# Patient Record
Sex: Female | Born: 1995 | Race: Black or African American | Hispanic: No | Marital: Single | Smoking: Former smoker
Health system: Southern US, Community
[De-identification: ages and names within clinical notes are randomized; demographics above are authoritative.]

## PROBLEM LIST (undated history)

## (undated) ENCOUNTER — Ambulatory Visit (HOSPITAL_COMMUNITY)
Admission: EM | Payer: No Typology Code available for payment source | Source: Home / Self Care | Admitting: Unknown Physician Specialty

## (undated) DIAGNOSIS — F209 Schizophrenia, unspecified: Secondary | ICD-10-CM

## (undated) DIAGNOSIS — E785 Hyperlipidemia, unspecified: Secondary | ICD-10-CM

## (undated) DIAGNOSIS — K219 Gastro-esophageal reflux disease without esophagitis: Secondary | ICD-10-CM

## (undated) DIAGNOSIS — E559 Vitamin D deficiency, unspecified: Secondary | ICD-10-CM

## (undated) DIAGNOSIS — J309 Allergic rhinitis, unspecified: Secondary | ICD-10-CM

## (undated) DIAGNOSIS — F4481 Dissociative identity disorder: Secondary | ICD-10-CM

## (undated) DIAGNOSIS — R519 Headache, unspecified: Secondary | ICD-10-CM

## (undated) DIAGNOSIS — E282 Polycystic ovarian syndrome: Secondary | ICD-10-CM

## (undated) DIAGNOSIS — N926 Irregular menstruation, unspecified: Secondary | ICD-10-CM

## (undated) DIAGNOSIS — R7303 Prediabetes: Secondary | ICD-10-CM

## (undated) DIAGNOSIS — F419 Anxiety disorder, unspecified: Secondary | ICD-10-CM

## (undated) DIAGNOSIS — F32A Depression, unspecified: Secondary | ICD-10-CM

## (undated) DIAGNOSIS — R12 Heartburn: Secondary | ICD-10-CM

## (undated) DIAGNOSIS — J45909 Unspecified asthma, uncomplicated: Secondary | ICD-10-CM

## (undated) DIAGNOSIS — N939 Abnormal uterine and vaginal bleeding, unspecified: Secondary | ICD-10-CM

## (undated) DIAGNOSIS — R569 Unspecified convulsions: Secondary | ICD-10-CM

## (undated) HISTORY — DX: Unspecified asthma, uncomplicated: J45.909

## (undated) HISTORY — DX: Vitamin D deficiency, unspecified: E55.9

## (undated) HISTORY — DX: Headache, unspecified: R51.9

## (undated) HISTORY — DX: Heartburn: R12

## (undated) HISTORY — PX: NO PRIOR SURGERIES: 100

## (undated) HISTORY — DX: Hyperlipidemia, unspecified: E78.5

## (undated) HISTORY — DX: Abnormal uterine and vaginal bleeding, unspecified: N93.9

## (undated) HISTORY — DX: Allergic rhinitis, unspecified: J30.9

## (undated) HISTORY — DX: Gastro-esophageal reflux disease without esophagitis: K21.9

## (undated) HISTORY — DX: Anxiety disorder, unspecified: F41.9

---

## 2019-09-17 ENCOUNTER — Emergency Department: Payer: Medicaid Other

## 2019-09-17 ENCOUNTER — Emergency Department
Admission: EM | Admit: 2019-09-17 | Discharge: 2019-09-17 | Disposition: A | Discharge status: Home / Self Care | Payer: Medicaid Other | Attending: Emergency Medicine | Admitting: Emergency Medicine

## 2019-09-17 ENCOUNTER — Other Ambulatory Visit: Payer: Self-pay

## 2019-09-17 ENCOUNTER — Encounter: Payer: Self-pay | Admitting: Emergency Medicine

## 2019-09-17 DIAGNOSIS — Y999 Unspecified external cause status: Secondary | ICD-10-CM | POA: Diagnosis not present

## 2019-09-17 DIAGNOSIS — Y939 Activity, unspecified: Secondary | ICD-10-CM | POA: Diagnosis not present

## 2019-09-17 DIAGNOSIS — W0110XA Fall on same level from slipping, tripping and stumbling with subsequent striking against unspecified object, initial encounter: Secondary | ICD-10-CM | POA: Insufficient documentation

## 2019-09-17 DIAGNOSIS — S0990XA Unspecified injury of head, initial encounter: Secondary | ICD-10-CM | POA: Insufficient documentation

## 2019-09-17 DIAGNOSIS — Y92129 Unspecified place in nursing home as the place of occurrence of the external cause: Secondary | ICD-10-CM | POA: Insufficient documentation

## 2019-09-17 DIAGNOSIS — M542 Cervicalgia: Secondary | ICD-10-CM | POA: Insufficient documentation

## 2019-09-17 DIAGNOSIS — S161XXA Strain of muscle, fascia and tendon at neck level, initial encounter: Secondary | ICD-10-CM | POA: Insufficient documentation

## 2019-09-17 DIAGNOSIS — W19XXXA Unspecified fall, initial encounter: Secondary | ICD-10-CM

## 2019-09-17 DIAGNOSIS — J45909 Unspecified asthma, uncomplicated: Secondary | ICD-10-CM | POA: Diagnosis not present

## 2019-09-17 HISTORY — DX: Unspecified convulsions: R56.9

## 2019-09-17 HISTORY — DX: Unspecified asthma, uncomplicated: J45.909

## 2019-09-17 LAB — POCT PREGNANCY, URINE: Preg Test, Ur: NEGATIVE

## 2019-09-17 MED ORDER — CYCLOBENZAPRINE HCL 5 MG PO TABS
5.0000 mg | ORAL_TABLET | Freq: Three times a day (TID) | ORAL | 0 refills | Status: AC | PRN
Start: 2019-09-17 — End: ?

## 2019-09-17 MED ORDER — IBUPROFEN 800 MG PO TABS
800.0000 mg | ORAL_TABLET | Freq: Three times a day (TID) | ORAL | 0 refills | Status: AC | PRN
Start: 2019-09-17 — End: ?

## 2019-09-17 MED ORDER — ACETAMINOPHEN 325 MG PO TABS
650.0000 mg | ORAL_TABLET | Freq: Once | ORAL | Status: AC
  Administered 2019-09-17: 650 mg via ORAL
  Filled 2019-09-17: qty 2

## 2019-09-17 MED ORDER — ONDANSETRON 4 MG PO TBDP: 4.0000 mg | ORAL_TABLET | Freq: Once | ORAL | Status: DC

## 2019-09-17 MED ORDER — METOCLOPRAMIDE HCL 10 MG PO TABS
10.0000 mg | ORAL_TABLET | Freq: Once | ORAL | Status: AC
  Administered 2019-09-17: 12:00:00 10 mg via ORAL
  Filled 2019-09-17: qty 1

## 2019-09-17 NOTE — ED Provider Notes (Signed)
[facility] Emergency Department Provider Note ____________________________________________  Time seen: 1139  I have reviewed the triage vital signs and the nursing notes.  HISTORY  Chief Complaint  Fall  HPI Lindsey Smith is a 24 y.o. female presents to the ED from home via EMS, for a mechanical fall.  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  She describes walking across the hall to attend her client, and then walking back across to her bedroom.  She lifted up her foot because she thought she had stepped on something wet, and apparently slipped her feet from under herself causing her to fall backwards, hitting the back of her head and neck.  She describes an unclear  duration of syncope, noting that it was 630 when she went to attend her client, and by the time she remembers waking up and looking at her cell phone it was proximately 830.  She remained on the floor until she was able to stand up and get himself to the bed.  She called her mother and waited for the neighbor to come.  She eventually called EMS who evaluated her at the scene.  Patient was apparently alert and oriented.  She reports feeling dizzy and reports an out of 10 head and neck pain.  She was able to ambulate to the stretcher without difficulty.  Patient denies any history of syncope, near syncope, hypoglycemia, or severe headaches.  Past Medical History:  Diagnosis Date  . Asthma   . Seizures (HCC)    pt states she has them when she takes abilify    There are no problems to display for this patient.   History reviewed. No pertinent surgical history.  Prior to Admission medications   Medication Sig Start Date End Date Taking? Authorizing Provider  cyclobenzaprine (FLEXERIL) 5 MG tablet Take 1 tablet (5 mg total) by mouth 3 (three) times daily as needed. 09/17/19   Renette Hsu, Charlesetta Ivory, PA-C  ibuprofen (ADVIL) 800 MG tablet Take 1  tablet (800 mg total) by mouth every 8 (eight) hours as needed. 09/17/19   Jaedin Regina, Charlesetta Ivory, PA-C    Allergies Patient has no known allergies.  History reviewed. No pertinent family history.  Social History Social History   Tobacco Use  . Smoking status: Former Games developer  . Smokeless tobacco: Never Used  Substance Use Topics  . Alcohol use: Never  . Drug use: Yes    Types: Marijuana    Review of Systems  Constitutional: Negative for fever. Eyes: Negative for visual changes. ENT: Negative for sore throat. Cardiovascular: Negative for chest pain. Respiratory: Negative for shortness of breath. Gastrointestinal: Negative for abdominal pain, vomiting and diarrhea. Genitourinary: Negative for dysuria. Musculoskeletal: Negative for back pain.  Reports neck pain as above. Skin: Negative for rash. Neurological: Positive for headaches.  Denies any focal weakness or numbness. ____________________________________________  PHYSICAL EXAM:  VITAL SIGNS: ED Triage Vitals  Enc Vitals Group     BP 09/17/19 1126 (!) 152/99     Pulse Rate 09/17/19 1126 78     Resp 09/17/19 1126 16     Temp 09/17/19 1126 98.3 F (36.8 C)     Temp Source 09/17/19 1126 Oral     SpO2 09/17/19 1126 98 %     Weight 09/17/19 1130 (!) 308 lb (139.7 kg)     Height 09/17/19 1130 5\' 11"  (1.803 m)     Head Circumference --  Peak Flow --      Pain Score 09/17/19 1130 10     Pain Loc --      Pain Edu? --      Excl. in [place]? --     Constitutional: Alert and oriented. Well appearing and in no distress. GCS=15 Head: Normocephalic and atraumatic. Eyes: Conjunctivae are normal. PERRL. Normal extraocular movements Nose: No congestion/rhinorrhea/epistaxis. Neck: Supple. Tenderness to the right lateral neck. C-collar in place.  Cardiovascular: Normal rate, regular rhythm. Normal distal pulses. Respiratory: Normal respiratory effort. No wheezes/rales/rhonchi. Gastrointestinal: Soft and nontender. No  distention. Musculoskeletal: Nontender with normal range of motion in all extremities.  Normal composite fist bilaterally. Neurologic: Cranial nerves II through XII grossly intact.  Normal UE/LE DTRs bilaterally.  Normal intrinsic and opposition testing noted.  Normal gait without ataxia. Normal speech and language. No gross focal neurologic deficits are appreciated. Skin:  Skin is warm, dry and intact. No rash noted. Psychiatric: Mood and affect are normal. Patient exhibits appropriate insight and judgment. ___________________________________________   LABORATORY  Labs Reviewed  POCT PREGNANCY, URINE  ___________________________________________  RADIOLOGY  CT Head & Cervical Spine w/o CM  IMPRESSION: Head CT: Normal.  Cervical spine CT: Normal. ____________________________________________  PROCEDURES  Reglan 10 mg PO Tylenol 650 mg PO  Procedures ____________________________________________  INITIAL IMPRESSION / ASSESSMENT AND PLAN / ED COURSE  Patient with ED evaluation of injury sustained following a mechanical fall.  Patient apparently lost her footing, and fell hitting the back of her head.  She was apparently unconscious for unspecified length of time.  She presents now with stable exam overall.  Images of the head and neck are negative for any acute findings.  Patient is alert, oriented, and appears in no acute distress patient we discharged at this time with prescriptions for cyclobenzaprine and ibuprofen.  She is encouraged to follow-up with primary provider return to the ED as necessary.  Dorathea Faerber was evaluated in Emergency Department on 09/17/2019 for the symptoms described in the history of present illness. She was evaluated in the context of the global COVID-19 pandemic, which necessitated consideration that the patient might be at risk for infection with the SARS-CoV-2 virus that causes COVID-19. Institutional protocols and algorithms that pertain to the  evaluation of patients at risk for COVID-19 are in a state of rapid change based on information released by regulatory bodies including the CDC and federal and state organizations. These policies and algorithms were followed during the patient's care in the ED. ____________________________________________  FINAL CLINICAL IMPRESSION(S) / ED DIAGNOSES  Final diagnoses:  Fall, initial encounter  Minor head injury, initial encounter  Acute strain of neck muscle, initial encounter      Melvenia Needles, PA-C 09/17/19 1856    Lavonia Drafts, MD 09/18/19 1447

## 2019-09-17 NOTE — ED Triage Notes (Signed)
Pt arrival via [facility] from work due to fall. Pt states that she working this morning when she had a mechanical fall. Pt denies LOC when it happened but states she doesn't remember much after the fall. Pt hit the back of her head and is having current neck and head pain. EMS states no hematoma/laceration is present.   Pt states she's feeling slightly dizzy. Pt states her head and her neck pain is a 10/10 on her right side.  Pt ambulated from EMS stretcher to bed with no difficulty, NAD noted, patient a&ox4.

## 2019-09-17 NOTE — ED Notes (Signed)
Pt states that she has severe neck and head pain that is a 10/10.

## 2019-09-17 NOTE — Discharge Instructions (Addendum)
Your exam and CT scans are negative at this time. Take the prescription meds as directed. Follow-up with your provider for continued symptoms.

## 2021-06-14 DIAGNOSIS — F411 Generalized anxiety disorder: Secondary | ICD-10-CM | POA: Insufficient documentation

## 2021-06-14 DIAGNOSIS — N921 Excessive and frequent menstruation with irregular cycle: Secondary | ICD-10-CM | POA: Insufficient documentation

## 2021-06-14 DIAGNOSIS — N939 Abnormal uterine and vaginal bleeding, unspecified: Secondary | ICD-10-CM | POA: Insufficient documentation

## 2021-06-14 DIAGNOSIS — F32A Depression, unspecified: Secondary | ICD-10-CM | POA: Insufficient documentation

## 2021-06-14 DIAGNOSIS — N920 Excessive and frequent menstruation with regular cycle: Secondary | ICD-10-CM | POA: Insufficient documentation

## 2021-06-18 ENCOUNTER — Other Ambulatory Visit (HOSPITAL_COMMUNITY): Payer: Self-pay | Admitting: Naturopath

## 2021-06-18 DIAGNOSIS — E282 Polycystic ovarian syndrome: Secondary | ICD-10-CM

## 2021-07-05 DIAGNOSIS — N760 Acute vaginitis: Secondary | ICD-10-CM | POA: Insufficient documentation

## 2021-07-06 ENCOUNTER — Ambulatory Visit (HOSPITAL_BASED_OUTPATIENT_CLINIC_OR_DEPARTMENT_OTHER)
Admit: 2021-07-06 | Discharge: 2021-07-06 | Disposition: A | Discharge status: Home / Self Care | Payer: No Typology Code available for payment source | Source: Home / Self Care

## 2021-07-06 ENCOUNTER — Emergency Department (HOSPITAL_COMMUNITY): Payer: No Typology Code available for payment source

## 2021-07-06 ENCOUNTER — Emergency Department
Admission: EM | Admit: 2021-07-06 | Discharge: 2021-07-06 | Disposition: A | Discharge status: Home / Self Care | Payer: No Typology Code available for payment source | Attending: Emergency Medicine | Admitting: Emergency Medicine

## 2021-07-06 ENCOUNTER — Encounter (HOSPITAL_COMMUNITY): Payer: Self-pay

## 2021-07-06 ENCOUNTER — Other Ambulatory Visit (HOSPITAL_BASED_OUTPATIENT_CLINIC_OR_DEPARTMENT_OTHER): Payer: Self-pay

## 2021-07-06 ENCOUNTER — Other Ambulatory Visit: Payer: Self-pay

## 2021-07-06 DIAGNOSIS — E282 Polycystic ovarian syndrome: Secondary | ICD-10-CM

## 2021-07-06 DIAGNOSIS — N83291 Other ovarian cyst, right side: Secondary | ICD-10-CM

## 2021-07-06 DIAGNOSIS — N730 Acute parametritis and pelvic cellulitis: Secondary | ICD-10-CM | POA: Insufficient documentation

## 2021-07-06 DIAGNOSIS — R1030 Lower abdominal pain, unspecified: Secondary | ICD-10-CM

## 2021-07-06 HISTORY — DX: Depression, unspecified: F32.A

## 2021-07-06 HISTORY — DX: Prediabetes: R73.03

## 2021-07-06 HISTORY — DX: Dissociative identity disorder: F44.81

## 2021-07-06 HISTORY — DX: Schizophrenia, unspecified: F20.9

## 2021-07-06 LAB — URINALYSIS WITH REFLEX CULTURE
Bacteria, URN: NONE SEEN
Bilirubin (Qual), URN: NEGATIVE
Epith Cells_Renal/Trans,URN: NEGATIVE /HPF
Glucose Qual, URN: NEGATIVE
Ketones, URN: NEGATIVE
Leukocyte Esterase, URN: POSITIVE — AB
Nitrite, URN: NEGATIVE
Occult Blood, URN: NEGATIVE
RBC, URN: NEGATIVE /HPF
Specific Gravity, URN: 1.034 g/mL — ABNORMAL HIGH (ref 1.006–1.027)
WBC, URN: NEGATIVE /HPF
pH, URN: 6 (ref 5.0–8.0)

## 2021-07-06 LAB — CBC, DIFF
% Basophils: 1 %
% Eosinophils: 4 %
% Immature Granulocytes: 0 %
% Lymphocytes: 35 %
% Monocytes: 8 %
% Neutrophils: 52 %
% Nucleated RBC: 0 %
Absolute Eosinophil Count: 0.42 10*3/uL (ref 0.00–0.50)
Absolute Lymphocyte Count: 3.63 10*3/uL (ref 1.00–4.80)
Basophils: 0.05 10*3/uL (ref 0.00–0.20)
Hematocrit: 37 % (ref 36.0–45.0)
Hemoglobin: 11.6 g/dL (ref 11.5–15.5)
Immature Granulocytes: 0.02 10*3/uL (ref 0.00–0.05)
MCH: 24.2 pg — ABNORMAL LOW (ref 27.3–33.6)
MCHC: 31.7 g/dL — ABNORMAL LOW (ref 32.2–36.5)
MCV: 76 fL — ABNORMAL LOW (ref 81–98)
Monocytes: 0.85 10*3/uL — ABNORMAL HIGH (ref 0.00–0.80)
Neutrophils: 5.56 10*3/uL (ref 1.80–7.00)
Nucleated RBC: 0 10*3/uL
Platelet Count: 277 10*3/uL (ref 150–400)
RBC: 4.79 10*6/uL (ref 3.80–5.00)
RDW-CV: 15.4 % — ABNORMAL HIGH (ref 11.0–14.5)
WBC: 10.53 10*3/uL — ABNORMAL HIGH (ref 4.3–10.0)

## 2021-07-06 LAB — COMPREHENSIVE METABOLIC PANEL
ALT (GPT): 10 U/L (ref 7–33)
AST (GOT): 13 U/L (ref 9–38)
Albumin: 3.8 g/dL (ref 3.5–5.2)
Alkaline Phosphatase (Total): 58 U/L (ref 25–100)
Anion Gap: 8 (ref 4–12)
Bilirubin (Total): 0.2 mg/dL (ref 0.2–1.3)
Calcium: 8.3 mg/dL — ABNORMAL LOW (ref 8.9–10.2)
Carbon Dioxide, Total: 25 meq/L (ref 22–32)
Chloride: 104 meq/L (ref 98–108)
Creatinine: 0.69 mg/dL (ref 0.38–1.02)
Glucose: 106 mg/dL (ref 62–125)
Potassium: 3.8 meq/L (ref 3.6–5.2)
Protein (Total): 7 g/dL (ref 6.0–8.2)
Sodium: 137 meq/L (ref 135–145)
Urea Nitrogen: 13 mg/dL (ref 8–21)
eGFR by CKD-EPI 2021: >60 mL/min/{1.73_m2} (ref >59)

## 2021-07-06 LAB — LIPASE: Lipase: 22 U/L (ref <70)

## 2021-07-06 LAB — PREGNANCY TEST, URINE
Pregnancy Test, URN: NEGATIVE
Specific Gravity, URN: 1.031 g/mL — ABNORMAL HIGH (ref 1.006–1.027)

## 2021-07-06 LAB — 1ST EXTRA RED TOP

## 2021-07-06 LAB — REFLEX CULTURE FOR UA

## 2021-07-06 MED ORDER — OXYCODONE HCL 5 MG OR TABS
5.0000 mg | ORAL_TABLET | ORAL | 0 refills | Status: DC | PRN
Start: 2021-07-06 — End: 2021-07-06
  Filled 2021-07-06: qty 10, 2d supply, fill #0

## 2021-07-06 MED ORDER — ONDANSETRON 4 MG OR TBDP
4.0000 mg | ORAL_TABLET | Freq: Two times a day (BID) | ORAL | 0 refills | Status: DC | PRN
Start: 2021-07-06 — End: 2021-08-19
  Filled 2021-07-06: qty 20, 10d supply, fill #0

## 2021-07-06 MED ORDER — IOHEXOL 350 MG/ML IV SOLN
120.0000 mL | Freq: Once | INTRAVENOUS | Status: AC | PRN
Start: 2021-07-06 — End: 2021-07-06
  Administered 2021-07-06: 120 mL via INTRAVENOUS

## 2021-07-06 MED ORDER — HYDROMORPHONE HCL 1 MG/ML IJ SOLN
0.5000 mg | INTRAMUSCULAR | Status: DC | PRN
Start: 2021-07-06 — End: 2021-07-06
  Administered 2021-07-06: 1 mg via INTRAVENOUS
  Filled 2021-07-06: qty 1

## 2021-07-06 MED ORDER — METRONIDAZOLE IN NACL 500 MG/100ML IV SOLN (WRAPPER)
500.0000 mg | Freq: Once | INTRAVENOUS | Status: AC
Start: 2021-07-06 — End: 2021-07-06
  Administered 2021-07-06: 500 mg via INTRAVENOUS
  Filled 2021-07-06: qty 100

## 2021-07-06 MED ORDER — METRONIDAZOLE 500 MG OR TABS
500.0000 mg | ORAL_TABLET | Freq: Two times a day (BID) | ORAL | 0 refills | Status: DC
Start: 2021-07-06 — End: 2021-07-14
  Filled 2021-07-06: qty 28, 14d supply, fill #0

## 2021-07-06 MED ORDER — ONDANSETRON HCL 4 MG/2ML IJ SOLN
8.0000 mg | Freq: Once | INTRAMUSCULAR | Status: AC
Start: 2021-07-06 — End: 2021-07-06
  Administered 2021-07-06: 8 mg via INTRAVENOUS
  Filled 2021-07-06: qty 4

## 2021-07-06 MED ORDER — CEFTRIAXONE SODIUM IN DEXTROSE 40 MG/ML IV SOLN
2.0000 g | Freq: Once | INTRAVENOUS | Status: AC
Start: 2021-07-06 — End: 2021-07-06
  Administered 2021-07-06: 2 g via INTRAVENOUS
  Filled 2021-07-06: qty 50

## 2021-07-06 MED ORDER — DOXYCYCLINE MONOHYDRATE 100 MG OR CAPS
100.0000 mg | ORAL_CAPSULE | Freq: Two times a day (BID) | ORAL | 0 refills | Status: DC
Start: 2021-07-06 — End: 2021-07-09
  Filled 2021-07-06: qty 28, 14d supply, fill #0

## 2021-07-06 MED ORDER — DOXYCYCLINE HYCLATE 100 MG OR TABS
100.0000 mg | ORAL_TABLET | Freq: Once | ORAL | Status: AC
Start: 2021-07-06 — End: 2021-07-06
  Administered 2021-07-06: 100 mg via ORAL
  Filled 2021-07-06: qty 1

## 2021-07-06 MED ORDER — OXYCODONE HCL 5 MG OR TABS
5.0000 mg | ORAL_TABLET | ORAL | 0 refills | Status: DC | PRN
Start: 2021-07-06 — End: 2021-07-09
  Filled 2021-07-06: qty 10, 2d supply, fill #0

## 2021-07-06 MED ORDER — METRONIDAZOLE IN NACL 500 MG/100ML IV SOLN (WRAPPER)
500.0000 mg | Freq: Three times a day (TID) | INTRAVENOUS | Status: DC
Start: 2021-07-06 — End: 2021-07-06
  Filled 2021-07-06: qty 100

## 2021-07-06 NOTE — Discharge Instructions (Addendum)
Please follow up with the OBGYN service for an appointment on Friday. If you have a change in your symptoms or worsening pain, then please return to the ER for evaluation. The cyst that you have

## 2021-07-06 NOTE — ED Triage Notes (Signed)
C/O pain to pelvis and lower abdomen since 6 am. +Pain with urination this am. Denies N/D. Seen at [facility] clinic and had U/S done this am. Hx UTI.

## 2021-07-06 NOTE — ED Provider Notes (Signed)
CHIEF COMPLAINT   Chief Complaint   Patient presents with   . Urine Problem   . Pelvic Pain            HISTORY OF PRESENT ILLNESS   This is a 26 year old female that is in the emergency room for transverse lower abdominal pain which began earlier today.  She does report that she went to her primary care provider yesterday for evaluation of vaginitis and concerns for STI.  A ultrasound was ordered and the patient was able to obtain the ultrasound earlier today.  She does have PCOS by history.  She does report that she has pain with urination and frequency of urination.  She is concerned that she has a UTI.  Denies fever or chills.  She is sexually active, however does not use protection.                 PAST MEDICAL AND SURGICAL HISTORY                MEDICATIONS AND ALLERGIES     OUTPATIENT MEDICATIONS:   Current Outpatient Medications   Medication Instructions   . doxycycline monohydrate (MONODOX) 100 mg, Oral, 2 times daily   . metroNIDAZOLE (FLAGYL) 500 mg, Oral, 2 times daily with meals   . ondansetron (ZOFRAN ODT) 4 mg, Oral, Every 12 hours PRN   . oxyCODONE 5 mg, Oral, Every 4 hours PRN       ALLERGIES:   Abilify [aripiprazole]          SOCIAL HISTORY   Social History     Tobacco Use   . Smoking status: Never   Substance Use Topics   . Alcohol use: Yes     Alcohol/week: 2.0 standard drinks     Types: 2 Standard drinks or equivalent per week   . Drug use: Yes     Types: Marijuana              PAST FAMILY HISTORY   Family History     Problem (# of Occurrences) Relation (Name,Age of Onset)    Fibromyalgia (1) Mother             REVIEW OF SYSTEMS   Review of Systems   Constitutional: Negative for fever and chills.   HENT: Negative for ear pain and sore throat.    Eyes: Negative for pain and visual disturbance.   Respiratory: Negative for shortness of breath and cough.    Cardiovascular: Negative for chest pain and palpitations.   Gastrointestinal: Positive for abdominal pain. Negative for nausea and vomiting.    Genitourinary: Positive for dysuria and urgency. Negative for hematuria.   Musculoskeletal: Negative for back pain and arthralgias.   Skin: Negative for rash and color change.   Neurological: Negative for seizures and syncope.   All other systems reviewed and are negative.              PHYSICAL EXAM   ED VITALS:  Vitals (Arrival)      T: (!) 35.8 C (07/06/21 1113)  BP: 112/75 (07/06/21 1113)  HR: 95 (07/06/21 1113)  RR: 16 (07/06/21 1113)  SpO2: 100 % (07/06/21 1113)     Vitals (Most recent in last 24 hrs)   T: 36.6 C (07/06/21 1630)  BP: 136/77 (07/06/21 1953)  HR: 81 (07/06/21 1953)  RR: 15 (07/06/21 1953)  SpO2: 100 % (07/06/21 1953)    T range: Temp  Min: 35.8 C  Max: 36.6 C  (no weight taken  for this visit)     (no height taken for this visit)     There is no height or weight on file to calculate BMI.       Physical Exam  Vitals and nursing note reviewed.   Constitutional:       General: She is not in acute distress.     Appearance: She is well-developed.   HENT:      Head: Normocephalic and atraumatic.   Eyes:      Conjunctiva/sclera: Conjunctivae normal.   Neck:      Musculoskeletal: Neck supple.   Cardiovascular:      Rate and Rhythm: Normal rate and regular rhythm.      Heart sounds: No murmur heard.  Pulmonary:      Effort: Pulmonary effort is normal. No respiratory distress.      Breath sounds: Normal breath sounds.   Abdominal:      Palpations: Abdomen is soft.      Tenderness: There is no abdominal tenderness.   Musculoskeletal:      Cervical back: Neck supple.   Skin:     General: Skin is warm and dry.   Neurological:      Mental Status: She is alert.               LABORATORY:   Labs Reviewed   URINALYSIS WITH REFLEX CULTURE - Abnormal       Result Value    Color, URN Yellow      Clarity, URN Clear      Specific Gravity, URN 1.034 (*)     pH, URN 6.0      Protein (Alb Semiquant), URN Trace (*)     Glucose Qual, URN Negative      Ketones, URN Negative      Bilirubin (Qual), URN Negative       Occult Blood, URN Negative      Nitrite, URN Negative      Leukocyte Esterase, URN Positive (*)     Urobilinogen, URN 0.1-1.9      Comments for Macroscopic, URN None      Collection Method Urine      WBC, URN 0-5(NEG)      RBC, URN 0-2(NEG)      Bacteria, URN Not Seen      Epith Cells_Squamous, URN >5(PRESENT) (*)     Epith Cells_Renal/Trans,URN <3(NEG)      Comments For Microscopic, URN None      Mucus, URN Present (*)     1st Extra Urine [facility] Rx Additional collection tube     PREGNANCY TEST, URINE - Abnormal    Specific Gravity, URN 1.031 (*)     Pregnancy Test, URN Negative     CBC, DIFF - Abnormal    WBC 10.53 (*)     RBC 4.79      Hemoglobin 11.6      Hematocrit 37      MCV 76 (*)     MCH 24.2 (*)     MCHC 31.7 (*)     Platelet Count 277      RDW-CV 15.4 (*)     % Neutrophils 52      % Lymphocytes 35      % Monocytes 8      % Eosinophils 4      % Basophils 1      % Immature Granulocytes 0      Neutrophils 5.56      Absolute Lymphocyte Count 3.63  Monocytes 0.85 (*)     Absolute Eosinophil Count 0.42      Basophils 0.05      Immature Granulocytes 0.02      Nucleated RBC 0.00      % Nucleated RBC 0     COMPREHENSIVE METABOLIC PANEL - Abnormal    Sodium 137      Potassium 3.8      Chloride 104      Carbon Dioxide, Total 25      Anion Gap 8      Glucose 106      Urea Nitrogen 13      Creatinine 0.69      Protein (Total) 7.0      Albumin 3.8      Bilirubin (Total) 0.2      Calcium 8.3 (*)     AST (GOT) 13      Alkaline Phosphatase (Total) 58      ALT (GPT) 10      eGFR by CKD-EPI 2021 >60     REFLEX CULTURE FOR UA    Reflex Culture for UA Order Processed     LIPASE    Lipase 22     GC&CHLAM NUCLEIC ACID DETECTN    GC&Chlam NA Spec Desc Vaginal      Chlam Trachomatis Nucleic Acid        N.Gonorrhoeae(GC) Nucleic Acid       TRICHOMONAS NUCLEIC ACID    Trichomonas Nucleic Acid Spec. Vaginal      Trichomonas Nucleic Acid Res.       URINE C/S   URINE C/S         IMAGING:     ED Wet Read -   CT Abdomen And Pelvis w  Contrast   Final Result      7.6 cm right ovarian cystic lesion, which is better characterized on pelvic ultrasound performed on same day. As recommended on the ultrasound, consider short interval sonographic follow-up in 6 weeks.      Otherwise no acute abdominopelvic abnormality.      This is a preliminary report dictated by Willette Cluster, MD ([id number]). Unless a final report appears below, an attending radiologist has not reviewed the images, and the report is not finalized.      ATTENDING FINAL REVIEW:      I agree with the preliminary report.                             I have personally reviewed the images and agree with the report (or as edited).          Radiology Final Result -   No image results found.              EKG DOCUMENTATION                 TRAUMA/STROKE/STEMI ACTIVATION                 SUICIDE RISK EVALUATION                 SEPSIS               4TH  YEAR RESIDENT            ED COURSE/MEDICAL DECISION MAKING      In summary, Elfa Wooton is a 26 year old female, with PMH significant for polycystic ovary syndrome, who presents with dysuria and abdominal pain see HPI  for details. External and in system records of patient were reviewed. Patient is a Independent historian    Differential diagnoses include but not limited to: Ovarian torsion, ovarian cyst, UTI, pyelonephritis, tubo-ovarian abscess.     Patient is nontoxic and well-appearing. Vitals are stable and normal.  Physical examination detailed above and most notable for abdominal pain, however physical exam is difficult due to patient's body habitus.     Labs, EKG, and imaging ordered and interpreted:  Labs ordered as above, reviewed, and notable for mild elevation in white blood cell count., Imaging ordered as above, reviewed, and my interpretation is notable for quite large ovarian cyst., Urinalysis ordered as above, reviewed, and notable for no signs of infection.    The patient's symptoms are concerning for a intra-abdominal pathology and a  CT scan was obtained which did not show acute surgical pathology, however did comment on the large ovarian cyst along with free fluid in the abdomen.  The ultrasound from earlier was evaluated and no evidence of a torsion or abscess was noted.  I am concerned for intermittent ovarian torsion, and OB/GYN was contacted to evaluate the patient.    I discussed patient's presentation and/or management with OB/GYN who came to evaluate the patient.  The OB/GYN was thorough and felt that the patient did not have concerns for a intermittent torsion, however did have concerns for PID.  The OB/GYN did recommend antibiotic therapy and kindly helped to guide the patient's care.    Patient's social determinants of health of medical literacy affect their care and management.    Based on patient's HPI,  examination, and today's work-up, it is my clinical judgement patient's presentation is most consistent with PID.     Ultimately the patient's symptoms improved with analgesic.  She was started on antibiotic therapy for pelvic inflammatory disease.  She received her first doses here in the emergency room.  Patient was discharged with a prescription for analgesics and antibiotic therapy.  Patient is stable and was discharged home with strict return precautions and instructions to follow-up with OB/GYN within the week or PCP within 24-48 hours. Patients diagnosis does not meet criteria for admission at this time.                  CLINICAL IMPRESSION/DISPOSITION   Clinical Impressions:   [N73.0] PID (acute pelvic inflammatory disease)         Disposition: Discharge         CRITICAL CARE - ATTENDING ONLY            ADDITIONAL INFORMATION REVIEWED  - ATTENDING ONLY           Lynnea Maizes, PA-C  07/06/21 2125

## 2021-07-06 NOTE — Consults (Signed)
Consult Note     Ashlee Long") - DOB: 01-17-1996 (26 year old female)  Gender Identity: Female  Preferred Pronouns: she/her/hers  Admit Date: 07/06/2021  Code Status: No Order         Consults    CHIEF CONCERN / IDENTIFICATION:     Ashlee Long is a 26 year old female G2 P0020, currently on no contraception presenting for acute, severe pelvic pain with onset at 6 AM this morning.     SUBJECTIVE   HISTORY OF PRESENT ILLNESS:   Patient was seen yesterday at [facility] family medicine for "venereal diseases".  Patient reported that she has vaginitis constantly and until pH is off.  Patient last had unprotected intercourse on 05/23/2021.    Patient reports she had some alcoholic beverages yesterday evening and this morning she woke up at 6 AM and felt extreme pelvic pain that felt sharp and like somebody was "stabbing her".  She also tried to urinate and felt like her bladder cramped and was unable to empty her bladder.  She has had UTIs in the past and was unsure if this was a UTI as it felt similar.  Past history is notable for gonorrhea, chlamydia, trichomonas, BV.     She has a history of PCOS and reports irregular menses.  She said she bleeds nearly every day and sometimes passes clots.  There is really a time where she is not bleeding.  If anything, she may have 1 week where she does not have bleeding, but restarted again.  She wonders if this is related to genetics as her grandmother reported always being on her menses.     She had a pelvic ultrasound done today to evaluate pelvic structures which was notable for an enlarged R OV cyst approximately 7cm versus hydrosalpinx.  Labs notable for WBC 10.5 K.  UA positive for leuk esterase but negative for nitrites.  Negative WBC.    Patient reports past medical history significant for mood disorders including possible schizophrenia, depression, anxiety, multiple personality disorder?.  She has been prescribed different psychiatric medicines including  Seroquel, BuSpar, sertraline.  She has not been taking medications and prefers to self medicate with cannabis, sertraline and Seroquel as needed    Review of Dates  Dating Summary    No pregnancy episode available       Ultrasounds   07/06/2020  Uterus  ======  Uterus: Normal  Uterus details:  Normal in size and shape  Uterus position:           anteverted  Description of uterine malformations:            None  Myometrium:   No fibroids seen  Endometrium:  heterogteneous  Cervix details:  normal  Uterus long      50 mm  Uterus ap         32 mm  Uterus tr          40 mm  Uterus Vol        33.4 cm  Endometrial thickness, total    9.8 mm       Right Ovary  =========  Rt ovary details:           enlarged. cystic structure measured 7.2 x 6.8 x 5.4 cm  Rt ovary D1      77 mm  Rt ovary D2      79 mm  Rt ovary D3      61 mm  Rt ovary Vol  194.3 cm  Rt ovarian Doppler other findings:      Venous and arterial spectral flow documented       Left Ovary  ========  Lt ovary details:           Appears normal  Lt ovary D1      34 mm  Lt ovary D2      28 mm  Lt ovary D3      16 mm  Lt ovary Vol     7.9 cm  Lt ovarian Doppler other findings:      Arterial and venous spectral flow documented       Cul de Sac  =========  Free fluid visualized: mild free fluid seen with hyperechoic structure measured 0.9 x 4.1 x 0.5 cm, likely prominent epiploic appendage.       IMPRESSION  =========  Enlarged right ovary due a cyst measuring up to 7.2 cm on the right, which may be the source of the patient's pain. No torsion. Differential includes hydrosalpinx versus  ovarian cyst. Surveillance with [place] within 6 weeks is recommended.  Free fluid seen within the cul-de-sac, favoring physiology.      HISTORY         History reviewed. No pertinent surgical history.    Social History     Tobacco Use    Smoking status: Never   Substance and Sexual Activity    Alcohol use: Yes     Alcohol/week: 2.0 standard drinks     Types: 2 Standard drinks or  equivalent per week    Drug use: Yes     Types: Marijuana    Sexual activity: Yes       Family History   Problem Relation Age of Onset    Fibromyalgia Mother         PAST GYNECOLOGIC HISTORY:  Menses irregular, bleeding daily for 180 days, says occasionally one week without bleeding then restarts   Cervical Cancer Screening History: Unsure, has had paps before, unsure if normal   STI History: gonorrhea, chlamydia, trichomonas  Sexually Active with female partners, unprotected  History of Sexual Dysfunction/Trauma: Did not ask  Contraception:None, previously had a Mirena IUD 2020, was under the impression she couldn't get pregnant with PCOS    Negative screen for domestic violence.    MEDICATIONS:   SCHEDULED MEDICATIONS:     cefTRIAXone, 2 g, Once    metroNIDAZOLE, 500 mg, Once    INFUSED MEDICATIONS:       PRN MEDICATIONS:    HYDROmorphone, 0.5-1 mg, q15 min PRN      ALLERGIES:   Abilify [aripiprazole]     OBJECTIVE     Vitals (Arrival)      T: (!) 35.8 C (07/06/21 1113)  BP: 112/75 (07/06/21 1113)  HR: 95 (07/06/21 1113)  RR: 16 (07/06/21 1113)  SpO2: 100 % (07/06/21 1113)     Vitals (Most recent in last 24 hrs)   T: 36.6 C (07/06/21 1630)  BP: 121/72 (07/06/21 1630)  HR: 78 (07/06/21 1630)  RR: 16 (07/06/21 1630)  SpO2: 100 % (07/06/21 1630)    T range: Temp  Min: 35.8 C  Max: 36.6 C  (no weight taken for this visit)     (no height taken for this visit)     There is no height or weight on file to calculate BMI.       GENERAL: alert, no distress  NEURO:  Grossly normal to observation, gait normal  PSYCH: alert, oriented  to person, place, and time, affect appropriate to mood  HEAD/FACE: Normocephalic. No masses, lesions, tenderness or abnormalities  CARDIOVASCULAR: warm and well perfused  RESPIRATORY: Nonlabored breathing   ABDOMEN:  Soft, diffusely tender over bilateral left and right lower quadrants and bladder  EXTREMITIES: no edema  SKIN: Skin color, texture, turgor normal. No rashes or concerning  lesions   PELVIC: External genitalia: No lesions.  Vagina: no lesions, white discharge, profound CMT on exam, jumped away from examination hand even with minimal touch of the cervix. Equally tender in bilateral left and right adnexa           Labs (last 24 hours):   Chemistries  CBC  LFT  Gases, other   137 104 13 106   11.6   AST: 13 ALT: 10  -/-/-/-   -/-/-/-   3.8 25 0.69   10.53 >< 277  AP: 58 T bili: 0.2  Lact (a): - Lact (v): -   eGFR: >60 Ca: 8.3   37   Prot: 7.0 Alb: 3.8  Trop I: - D-dimer: -   Mg: - PO4: -  ANC: 5.56     BNP: - Anti-Xa: -     ALC: 3.63    INR: -        IMAGING: see above  I have reviewed the latest radiology results            ASSESSMENT/PLAN      #. Pelvic inflammatory disease: Patient with WBC 10.5, afebrile, but recent unprotected intercourse and history of STIs. Patient without nausea or vomiting. Torsion was considered as possible etiology given 7cm cyst, but tenderness equally bilaterally in adnexa. UTI considered based off of leuk esterase,however no nitrites. Recommended ED to send urine culture  - Diagnosed PID based on cervical motion tenderness which was profound on exam   - Collected GC/CT/trich (sent)  - Recommended ceftriaxone IM, metronidazole PO and doxy PO 14 day course  - Will have her f/u on Friday for assessment of clinical improvement in gyn clinic.     #. Adnexal cyst 7cm: Simple appearing. Uncertain if this is an acute physiologic process or enlarging pathologic process.  - Will assess resolution at follow-up visits and attempt to obtain previous records.  - Return precautions provided  - Given exam not concerning for torsion (no unilateral tenderness) discussed no indication for emergency surgery at this time.Fredia Beets, MD, MS  OBGYN Resident PGY4    Attending Attestation:   I discussed this patient's clinical presentation with Dr. Vella Raring and agree with her assessment. This patient will have close follow-up at [facility]. May well require  laparoscopic intervention if pain not improved, raising concern for possible unrecognized torsion.  Jettie Booze, MD  [facility] Obstetrics and Gynecology

## 2021-07-07 ENCOUNTER — Telehealth (HOSPITAL_BASED_OUTPATIENT_CLINIC_OR_DEPARTMENT_OTHER): Payer: Self-pay | Admitting: Unknown Physician Specialty

## 2021-07-07 LAB — URINE C/S: Culture: 11000 — AB

## 2021-07-07 LAB — GC&CHLAM NUCLEIC ACID DETECTN
Chlam Trachomatis Nucleic Acid: NEGATIVE
N.Gonorrhoeae(GC) Nucleic Acid: NEGATIVE

## 2021-07-07 LAB — TRICHOMONAS NUCLEIC ACID: Trichomonas Nucleic Acid Res.: NEGATIVE

## 2021-07-07 NOTE — Telephone Encounter (Signed)
Per Dr Campbell Stall this patient in the ED today. She has PID. She is getting outpatient tx as she met that criteria and looks well appearing. She needs a f/u appointment on Friday in [facility] preferrably.

## 2021-07-07 NOTE — Telephone Encounter (Signed)
Called pt to schedule follow up   UGYN full so put on Dr Evangeline Gula schedule at 2pm     Pt unable to come in the morning due to work schedule.

## 2021-07-09 ENCOUNTER — Other Ambulatory Visit (HOSPITAL_COMMUNITY): Payer: Self-pay

## 2021-07-09 ENCOUNTER — Encounter (HOSPITAL_BASED_OUTPATIENT_CLINIC_OR_DEPARTMENT_OTHER): Payer: Self-pay

## 2021-07-09 ENCOUNTER — Other Ambulatory Visit (HOSPITAL_BASED_OUTPATIENT_CLINIC_OR_DEPARTMENT_OTHER): Payer: Self-pay

## 2021-07-09 ENCOUNTER — Telehealth (HOSPITAL_BASED_OUTPATIENT_CLINIC_OR_DEPARTMENT_OTHER): Payer: Self-pay | Admitting: Unknown Physician Specialty

## 2021-07-09 ENCOUNTER — Encounter (HOSPITAL_BASED_OUTPATIENT_CLINIC_OR_DEPARTMENT_OTHER): Payer: Self-pay | Admitting: Obstetrics & Gynecology

## 2021-07-09 ENCOUNTER — Ambulatory Visit
Payer: No Typology Code available for payment source | Attending: Obstetrics & Gynecology | Admitting: Obstetrics & Gynecology

## 2021-07-09 VITALS — BP 117/77 | HR 85 | Temp 97.6°F | Wt 314.5 lb

## 2021-07-09 DIAGNOSIS — N83201 Unspecified ovarian cyst, right side: Secondary | ICD-10-CM | POA: Insufficient documentation

## 2021-07-09 DIAGNOSIS — R87610 Atypical squamous cells of undetermined significance on cytologic smear of cervix (ASC-US): Secondary | ICD-10-CM

## 2021-07-09 DIAGNOSIS — G8929 Other chronic pain: Secondary | ICD-10-CM

## 2021-07-09 DIAGNOSIS — N730 Acute parametritis and pelvic cellulitis: Secondary | ICD-10-CM | POA: Insufficient documentation

## 2021-07-09 DIAGNOSIS — N939 Abnormal uterine and vaginal bleeding, unspecified: Secondary | ICD-10-CM | POA: Insufficient documentation

## 2021-07-09 DIAGNOSIS — J45909 Unspecified asthma, uncomplicated: Secondary | ICD-10-CM | POA: Insufficient documentation

## 2021-07-09 MED ORDER — METRONIDAZOLE 0.75 % VA GEL
1.0000 | Freq: Every evening | VAGINAL | 0 refills | Status: DC
Start: 2021-07-09 — End: 2021-07-14
  Filled 2021-07-09: qty 70, 5d supply, fill #0

## 2021-07-09 MED ORDER — FLUCONAZOLE 150 MG OR TABS
150.0000 mg | ORAL_TABLET | Freq: Once | ORAL | 1 refills | Status: AC
Start: 2021-07-09 — End: 2021-07-10
  Filled 2021-07-09: qty 2, 4d supply, fill #0

## 2021-07-09 MED ORDER — ALBUTEROL SULFATE HFA 108 (90 BASE) MCG/ACT IN AERS
1.0000 | INHALATION_SPRAY | Freq: Four times a day (QID) | RESPIRATORY_TRACT | 1 refills | Status: DC | PRN
Start: 2021-07-09 — End: 2021-12-12
  Filled 2021-07-09: qty 8.5, 25d supply, fill #0

## 2021-07-09 MED ORDER — DOXYCYCLINE MONOHYDRATE 100 MG OR CAPS
100.0000 mg | ORAL_CAPSULE | Freq: Two times a day (BID) | ORAL | 0 refills | Status: DC
Start: 2021-07-09 — End: 2021-07-09
  Filled 2021-07-09: qty 14, 7d supply, fill #0

## 2021-07-09 MED ORDER — TRAMADOL HCL 50 MG OR TABS
50.0000 mg | ORAL_TABLET | Freq: Four times a day (QID) | ORAL | 1 refills | Status: AC | PRN
Start: 2021-07-09 — End: 2021-07-16
  Filled 2021-07-09: qty 12, 3d supply, fill #0

## 2021-07-09 NOTE — Patient Instructions (Signed)
Plan:     Pelvic inflammatory disease     Do a total course of 14 days of doxycycline antibiotics     Switch over to vaginal metrogel     I gave you a yeast pill to take after finishing all antibiotics     Surgery plan: 7 cm right ovarian cyst    Laparoscopic ovarian cystectomy, possible cyst drainage, possible unilateral salpingectomy (removal of a fallopian tube).     Consider doing endometrial biopsy and or IUD placement during this surgery for your bleeding history.     Go to ED for sudden worsening pain with nausea and vomiting.

## 2021-07-09 NOTE — Progress Notes (Addendum)
Ashlee Long visit  07/09/2021     Chief Complaint   Patient presents with   . Gyn Concern     Follow up check for PID     Subjective:     Ashlee Long is a 26 year old G2P0020 who presents on 07/09/2021     HISTORY OF PRESENT ILLNESS: Here for ED follow up. Seen by Dr. Delight Hoh on 2/28 and diagnosed with PID. + CMT, treated w/ 1 week of flagyl and doxycycline.   She reports the PID pain is improving. She has recent negative GC CT testing. She notes worsening of her RLQ pain, with intermittent sharp stabby pain.     We discussed wanting to "cool down" and successfully treat the PID before proceeding w/ laparoscopy for intermittent torsion if possible.  Risks of worsening infection weighed against risk of not wanting to compromise her ovary.     She does want to maintain the option of future fertility.     ED - pelvic ultrasound was notable for an enlarged R OV cyst approximately 7cm versus hydrosalpinx.  Labs notable for WBC 10.5 K.  UA positive for leuk esterase but negative for nitrites. She had IV CFX in the ED.   She has not been tolerating the metronidazole at all and vomits it up even w/ zofran.     Per last note -  Patient reports past medical history significant for mood disorders including possible schizophrenia, depression, anxiety, multiple personality disorder.  She has been prescribed different psychiatric medicines including Seroquel, BuSpar, sertraline.  She has not been taking medications and prefers to self medicate with cannabis, sertraline and Seroquel as needed    PAST GYNECOLOGIC HISTORY:  Menses irregular, bleeding daily for 180 days, says occasionally one week without bleeding then restarts   Has had EMB before in the past   Bleeding responded to her Mirena IUD, COCs were not helpful   Cervical Cancer Screening History: Unsure, has had paps before, unsure if normal   STI History: gonorrhea, chlamydia, trichomonas  Sexually Active with female partners, unprotected  Contraception: None,  previously had a Mirena IUD 2020     OB history:   TOP x 2    Immunizations: [  ] did not ask about Gardasil     Past Medical History:   Diagnosis Date   . Depression    . 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HCC)    . Pre-diabetes    . Schizophrenia (HCC)      No past surgical history on file.  No prior surgeries     Current Outpatient Medications   Medication Sig Dispense Refill   . albuterol HFA (ProAir HFA) 108 (90 Base) MCG/ACT inhaler Inhale 1 to  2 puffs by mouth every 6 hours as needed for shortness of breath/wheezing. 8.5 g 1   . doxycycline monohydrate 100 MG capsule Take 1 capsule (100 mg) by mouth 2 times a day for 7 days for infection. Take until gone. 14 capsule 0   . fluconazole 150 MG tablet Take 1 tablet (150 mg) by mouth one time for 1 dose. Repeat again in 3 days 2 tablet 1   . metroNIDAZOLE 0.75 % vaginal gel Place 1 applicatorful into the vagina at bedtime for 5 days. 70 g 0   . metroNIDAZOLE 500 MG tablet Take 1 tablet (500 mg) by mouth 2 times a day with meals for 14 days. 28 tablet 0   . ondansetron 4 MG disintegrating tablet Dissolve 1 tablet (4  mg) on top of tongue and swallow every 12 hours as needed for nausea/vomiting. 20 tablet 0   . traMADol 50 MG tablet Take 1 tablet (50 mg) by mouth every 6 hours as needed for moderate pain or severe pain for up to 7 days. 12 tablet 1     No current facility-administered medications for this visit.      No allergies   Itching w/ percocet       Objective:      Vitals:    07/09/21 1321   BP: 117/77   BP Cuff Size: Large   BP Site: Right Arm   BP Position: Sitting   Pulse: 85   Temp: 36.4 C   TempSrc: Temporal   SpO2: 99%   Weight: (!) 142.7 kg (314 lb 8 oz)       PE:  NAD  Heart: RRR extremities warm and well perfused  Lung: CTAB  breathing easily on RA   Abd:  soft, nontender, no rebound or guarding   Ext:  no calf tenderness, no swelling  Pelvic:   External genitalia normal, no vulvar lesions   Normal vagina, physiologic vaginal discharge   Normal  appearing cervix, no cervical lesions   Normal sized uterus on bimanual exam   R sided adnexal tenderness - unable to clearly palpate a mass due to tenderness   CMT has resolved     Assessment and Plan:        Pelvic inflammatory disease:   - Diagnosed PID based on cervical motion tenderness   - GC/CT/trich (negative)  - Recommended ceftriaxone IM, metronidazole PO and doxy PO 14 day course  - She can pause the flagyl as not tolerating PO, given 14 day doxy course    #. Adnexal cyst 7cm: Simple appearing. Unilateral pain in this area is persistent and she is concerned for intermittent torsion   - Discussed need for expedited surgery but would ideally wait until her PID has been treated   - Reviewed precautions and s/sx acute torsion and reasons to return to ED for emergency surgery       [name] was seen today for gyn concern.    PID (acute pelvic inflammatory disease)  -     Referral to Obstetrics and [facility]  -     doxycycline monohydrate 100 MG capsule; Take 1 capsule (100 mg) by mouth 2 times a day for 7 days for infection. Take until gone.  Dispense: 14 capsule; Refill: 0  -     metroNIDAZOLE 0.75 % vaginal gel; Place 1 applicatorful into the vagina at bedtime for 5 days.  Dispense: 70 g; Refill: 0  -     fluconazole 150 MG tablet; Take 1 tablet (150 mg) by mouth one time for 1 dose. Repeat again in 3 days  Dispense: 2 tablet; Refill: 1  -     traMADol 50 MG tablet; Take 1 tablet (50 mg) by mouth every 6 hours as needed for moderate pain or severe pain for up to 7 days.  Dispense: 12 tablet; Refill: 1  -     albuterol HFA (ProAir HFA) 108 (90 Base) MCG/ACT inhaler; Inhale 1 to  2 puffs by mouth every 6 hours as needed for shortness of breath/wheezing.  Dispense: 8.5 g; Refill: 1    Right ovarian cyst      Plan for surgery shortly after completing PO antbx course, unless her pain has resolved, then would repeat US.     She is aware  we would typically proceed immediately/expeditiously for suspected  torsion, but interpreting her pain in the setting of diagnosed PID is more complicated and would not want to do surgery right away if this was a TOA (ideal to tx w/ antibiotics first since she is stable).     Consents signed. Will send prep for procedure. Ordered heparin, ancef.     Kathlen Mody MD

## 2021-07-09 NOTE — Addendum Note (Signed)
Addended by: Kathlen Mody E on: 07/09/2021 03:28 PM     Modules accepted: Orders

## 2021-07-09 NOTE — Telephone Encounter (Signed)
Dr. Vern Claude,  Please review. Patient was seen and preop completed in clinic with Dr. Doreene Burke for EUA, [facility] Ovarian cystectomy, possible cyst drainage, possible [place], possible EMB.    Scheduled with you for 3/28 into released gyn onc block.

## 2021-07-10 DIAGNOSIS — E611 Iron deficiency: Secondary | ICD-10-CM | POA: Insufficient documentation

## 2021-07-10 DIAGNOSIS — R102 Pelvic and perineal pain: Secondary | ICD-10-CM | POA: Insufficient documentation

## 2021-07-11 ENCOUNTER — Ambulatory Visit
Admission: EM | Admit: 2021-07-11 | Discharge: 2021-07-12 | Disposition: A | Discharge status: Home / Self Care | Payer: No Typology Code available for payment source | Attending: Obstetrics & Gynecology | Admitting: Obstetrics & Gynecology

## 2021-07-11 ENCOUNTER — Emergency Department (EMERGENCY_DEPARTMENT_HOSPITAL): Payer: No Typology Code available for payment source

## 2021-07-11 ENCOUNTER — Encounter (HOSPITAL_BASED_OUTPATIENT_CLINIC_OR_DEPARTMENT_OTHER): Payer: No Typology Code available for payment source | Admitting: Unknown Physician Specialty

## 2021-07-11 ENCOUNTER — Encounter (HOSPITAL_COMMUNITY)
Admission: EM | Disposition: A | Discharge status: Home / Self Care | Payer: Self-pay | Source: Home / Self Care | Attending: Emergency Medicine

## 2021-07-11 ENCOUNTER — Other Ambulatory Visit: Payer: Self-pay

## 2021-07-11 ENCOUNTER — Encounter (HOSPITAL_COMMUNITY): Payer: Self-pay

## 2021-07-11 ENCOUNTER — Ambulatory Visit (HOSPITAL_COMMUNITY): Payer: Self-pay

## 2021-07-11 ENCOUNTER — Encounter (HOSPITAL_COMMUNITY): Payer: Self-pay | Admitting: Unknown Physician Specialty

## 2021-07-11 ENCOUNTER — Encounter (HOSPITAL_COMMUNITY): Payer: Self-pay | Admitting: Student in an Organized Health Care Education/Training Program

## 2021-07-11 ENCOUNTER — Other Ambulatory Visit (HOSPITAL_BASED_OUTPATIENT_CLINIC_OR_DEPARTMENT_OTHER): Payer: Self-pay

## 2021-07-11 ENCOUNTER — Ambulatory Visit (HOSPITAL_BASED_OUTPATIENT_CLINIC_OR_DEPARTMENT_OTHER): Admit: 2021-07-11 | Payer: No Typology Code available for payment source | Admitting: Obstetrics & Gynecology

## 2021-07-11 DIAGNOSIS — N939 Abnormal uterine and vaginal bleeding, unspecified: Secondary | ICD-10-CM | POA: Insufficient documentation

## 2021-07-11 DIAGNOSIS — N83201 Unspecified ovarian cyst, right side: Secondary | ICD-10-CM | POA: Insufficient documentation

## 2021-07-11 DIAGNOSIS — R69 Illness, unspecified: Secondary | ICD-10-CM

## 2021-07-11 DIAGNOSIS — R102 Pelvic and perineal pain: Secondary | ICD-10-CM | POA: Insufficient documentation

## 2021-07-11 DIAGNOSIS — R1909 Other intra-abdominal and pelvic swelling, mass and lump: Secondary | ICD-10-CM

## 2021-07-11 DIAGNOSIS — N739 Female pelvic inflammatory disease, unspecified: Secondary | ICD-10-CM

## 2021-07-11 DIAGNOSIS — J45909 Unspecified asthma, uncomplicated: Secondary | ICD-10-CM | POA: Insufficient documentation

## 2021-07-11 HISTORY — DX: Polycystic ovarian syndrome: E28.2

## 2021-07-11 HISTORY — PX: LAPAROSCOPY DIAGNOSTIC / BIOPSY / ASPIRATION / LYSIS: SUR762

## 2021-07-11 HISTORY — DX: Irregular menstruation, unspecified: N92.6

## 2021-07-11 LAB — CBC, DIFF
% Basophils: 1 %
% Eosinophils: 3 %
% Immature Granulocytes: 0 %
% Lymphocytes: 32 %
% Monocytes: 9 %
% Neutrophils: 55 %
% Nucleated RBC: 0 %
Absolute Eosinophil Count: 0.31 10*3/uL (ref 0.00–0.50)
Absolute Lymphocyte Count: 3.43 10*3/uL (ref 1.00–4.80)
Basophils: 0.05 10*3/uL (ref 0.00–0.20)
Hematocrit: 37 % (ref 36.0–45.0)
Hemoglobin: 12 g/dL (ref 11.5–15.5)
Immature Granulocytes: 0.04 10*3/uL (ref 0.00–0.05)
MCH: 24.4 pg — ABNORMAL LOW (ref 27.3–33.6)
MCHC: 32.3 g/dL (ref 32.2–36.5)
MCV: 76 fL — ABNORMAL LOW (ref 81–98)
Monocytes: 0.94 10*3/uL — ABNORMAL HIGH (ref 0.00–0.80)
Neutrophils: 5.84 10*3/uL (ref 1.80–7.00)
Nucleated RBC: 0 10*3/uL
Platelet Count: 327 10*3/uL (ref 150–400)
RBC: 4.91 10*6/uL (ref 3.80–5.00)
RDW-CV: 15.4 % — ABNORMAL HIGH (ref 11.0–14.5)
WBC: 10.61 10*3/uL — ABNORMAL HIGH (ref 4.3–10.0)

## 2021-07-11 LAB — PROTHROMBIN & PTT
Partial Thromboplastin Time: 27 s (ref 22–35)
Prothrombin INR: 1 (ref 0.8–1.3)
Prothrombin Time Patient: 13.4 s (ref 10.7–15.6)

## 2021-07-11 LAB — COMPREHENSIVE METABOLIC PANEL
ALT (GPT): 15 U/L (ref 7–33)
AST (GOT): 19 U/L (ref 9–38)
Albumin: 4.2 g/dL (ref 3.5–5.2)
Alkaline Phosphatase (Total): 54 U/L (ref 25–100)
Anion Gap: 8 (ref 4–12)
Bilirubin (Total): 0.3 mg/dL (ref 0.2–1.3)
Calcium: 9.1 mg/dL (ref 8.9–10.2)
Carbon Dioxide, Total: 25 meq/L (ref 22–32)
Chloride: 103 meq/L (ref 98–108)
Creatinine: 0.67 mg/dL (ref 0.38–1.02)
Glucose: 90 mg/dL (ref 62–125)
Potassium: 3.7 meq/L (ref 3.6–5.2)
Protein (Total): 7.4 g/dL (ref 6.0–8.2)
Sodium: 136 meq/L (ref 135–145)
Urea Nitrogen: 13 mg/dL (ref 8–21)
eGFR by CKD-EPI 2021: >60 mL/min/{1.73_m2} (ref >59)

## 2021-07-11 LAB — 1ST EXTRA BLUE TOP

## 2021-07-11 LAB — 1ST EXTRA LAVENDER TOP

## 2021-07-11 LAB — SARS-COV-2 (COVID-19) QUALITATIVE RAPID PCR: COVID-19 Coronavirus Qual PCR Result: NOT DETECTED

## 2021-07-11 LAB — 1ST EXTRA PEARL TOP

## 2021-07-11 LAB — 1ST EXTRA LIME GREEN TOP

## 2021-07-11 LAB — PREGNANCY (HCG), SERUM, QUANT: Pregnancy (HCG), SRM: <1 m[IU]/mL (ref <6)

## 2021-07-11 LAB — 1ST EXTRA GOLD TOP

## 2021-07-11 LAB — MAGNESIUM: Magnesium: 1.9 mg/dL (ref 1.8–2.4)

## 2021-07-11 SURGERY — EXCISION, CYST, OVARY, LAPAROSCOPIC
Anesthesia: General | Site: Abdomen | Wound class: Class II/ Clean Contaminated

## 2021-07-11 MED ORDER — LIDOCAINE 4 % EX CREA
TOPICAL_CREAM | CUTANEOUS | Status: DC | PRN
Start: 2021-07-11 — End: 2021-07-14

## 2021-07-11 MED ORDER — HYDROMORPHONE HCL 1 MG/ML IJ SOLN
INTRAMUSCULAR | Status: DC
Start: 2021-07-11 — End: 2021-07-12
  Filled 2021-07-11: qty 1

## 2021-07-11 MED ORDER — LIDOCAINE HCL 1 % IJ SOLN
INTRAMUSCULAR | Status: DC | PRN
Start: 2021-07-11 — End: 2021-07-11
  Administered 2021-07-11: 60 mg via INTRAVENOUS

## 2021-07-11 MED ORDER — PROPOFOL 10 MG/ML IV EMUL WRAPPER (OSM ONLY)
INTRAVENOUS | Status: DC | PRN
Start: 2021-07-11 — End: 2021-07-11
  Administered 2021-07-11: 150 ug/kg/min via INTRAVENOUS

## 2021-07-11 MED ORDER — AMOXICILLIN-POT CLAVULANATE 875-125 MG OR TABS
875.0000 mg | ORAL_TABLET | Freq: Two times a day (BID) | ORAL | 0 refills | Status: AC
Start: 2021-07-11 — End: 2021-07-18
  Filled 2021-07-11: qty 14, 7d supply, fill #0

## 2021-07-11 MED ORDER — OXYCODONE HCL 5 MG OR TABS
5.0000 mg | ORAL_TABLET | ORAL | Status: DC | PRN
Start: 2021-07-11 — End: 2021-07-14

## 2021-07-11 MED ORDER — PLASMA-LYTE A IV SOLN
INTRAVENOUS | Status: DC | PRN
Start: 2021-07-11 — End: 2021-07-11

## 2021-07-11 MED ORDER — PROPOFOL 10 MG/ML IV EMUL WRAPPER (OSM ONLY)
INTRAVENOUS | Status: DC | PRN
Start: 2021-07-11 — End: 2021-07-11
  Administered 2021-07-11: 180 mg via INTRAVENOUS

## 2021-07-11 MED ORDER — BUPIVACAINE HCL (PF) 0.25 % IJ SOLN
INTRAMUSCULAR | Status: AC
Start: 2021-07-11 — End: 2021-07-11
  Filled 2021-07-11: qty 20

## 2021-07-11 MED ORDER — ROCURONIUM BROMIDE 50 MG/5ML IV SOLN
INTRAVENOUS | Status: DC | PRN
Start: 2021-07-11 — End: 2021-07-11
  Administered 2021-07-11: 100 mg via INTRAVENOUS

## 2021-07-11 MED ORDER — FENTANYL CITRATE (PF) 50 MCG/ML IJ SOLN WRAPPER (ANESTHESIA OSM ONLY)
INTRAMUSCULAR | Status: DC | PRN
Start: 2021-07-11 — End: 2021-07-11
  Administered 2021-07-11: 50 ug via INTRAVENOUS
  Administered 2021-07-11 (×2): 25 ug via INTRAVENOUS

## 2021-07-11 MED ORDER — HYDROMORPHONE HCL 1 MG/ML IJ SOLN
0.5000 mg | INTRAMUSCULAR | Status: DC | PRN
Start: 2021-07-11 — End: 2021-07-14
  Administered 2021-07-11 (×2): 0.5 mg via INTRAVENOUS
  Filled 2021-07-11 (×2): qty 1

## 2021-07-11 MED ORDER — HEPARIN SODIUM (PORCINE) 5000 UNIT/ML IJ SOLN
5000.0000 [IU] | INTRAMUSCULAR | Status: DC
Start: 2021-07-11 — End: 2021-07-12

## 2021-07-11 MED ORDER — SUGAMMADEX SODIUM 200 MG/2ML IV SOLN
INTRAVENOUS | Status: DC | PRN
Start: 2021-07-11 — End: 2021-07-11
  Administered 2021-07-11: 400 mg via INTRAVENOUS

## 2021-07-11 MED ORDER — ONDANSETRON HCL 4 MG/2ML IJ SOLN
4.0000 mg | INTRAMUSCULAR | Status: DC | PRN
Start: 2021-07-11 — End: 2021-07-14

## 2021-07-11 MED ORDER — MEPERIDINE HCL 25 MG/ML IJ SOLN
10.0000 mg | INTRAMUSCULAR | Status: DC | PRN
Start: 2021-07-11 — End: 2021-07-14

## 2021-07-11 MED ORDER — HYDROMORPHONE HCL 1 MG/ML IJ SOLN
0.2000 mg | INTRAMUSCULAR | Status: DC | PRN
Start: 2021-07-11 — End: 2021-07-14
  Administered 2021-07-11: 0.2 mg via INTRAVENOUS

## 2021-07-11 MED ORDER — ACETAMINOPHEN 500 MG OR TABS
1000.0000 mg | ORAL_TABLET | ORAL | Status: AC
Start: 2021-07-11 — End: 2021-07-11
  Administered 2021-07-11: 1000 mg via ORAL

## 2021-07-11 MED ORDER — DEXAMETHASONE SODIUM PHOSPHATE 4 MG/ML IJ SOLN
INTRAMUSCULAR | Status: DC | PRN
Start: 2021-07-11 — End: 2021-07-11
  Administered 2021-07-11: 4 mg via INTRAVENOUS

## 2021-07-11 MED ORDER — LACTATED RINGERS IV SOLN
10.0000 mL/h | INTRAVENOUS | Status: DC
Start: 2021-07-11 — End: 2021-07-14

## 2021-07-11 MED ORDER — OXIDIZED CELLULOSE - SURGICEL 2 X 14 PADS
MEDICATED_PAD | CUTANEOUS | Status: DC | PRN
Start: 2021-07-11 — End: 2021-07-11
  Administered 2021-07-11: 1 via TOPICAL

## 2021-07-11 MED ORDER — MIDAZOLAM HCL (PF) 1 MG/ML IJ SOLN WRAPPER (ANESTHESIA OSM ONLY)
INTRAMUSCULAR | Status: DC | PRN
Start: 2021-07-11 — End: 2021-07-11
  Administered 2021-07-11: 2 mg via INTRAVENOUS

## 2021-07-11 MED ORDER — FENTANYL CITRATE (PF) 100 MCG/2ML IJ SOLN
25.0000 ug | INTRAMUSCULAR | Status: DC | PRN
Start: 2021-07-11 — End: 2021-07-14

## 2021-07-11 MED ORDER — SCOPOLAMINE 1 MG/3DAYS TD PT72
1.0000 | MEDICATED_PATCH | TRANSDERMAL | Status: DC
Start: 2021-07-11 — End: 2021-07-12

## 2021-07-11 MED ORDER — ACETAMINOPHEN 500 MG OR TABS
500.0000 mg | ORAL_TABLET | Freq: Four times a day (QID) | ORAL | 0 refills | Status: DC | PRN
Start: 2021-07-11 — End: 2022-05-18
  Filled 2021-07-11: qty 40, 5d supply, fill #0

## 2021-07-11 MED ORDER — KETOROLAC TROMETHAMINE 30 MG/ML IJ SOLN WRAPPER (ANESTHESIA OSM ONLY)
INTRAMUSCULAR | Status: DC | PRN
Start: 2021-07-11 — End: 2021-07-11
  Administered 2021-07-11: 15 mg via INTRAVENOUS

## 2021-07-11 MED ORDER — ONDANSETRON HCL 4 MG/2ML IJ SOLN
INTRAMUSCULAR | Status: DC | PRN
Start: 2021-07-11 — End: 2021-07-11
  Administered 2021-07-11: 4 mg via INTRAVENOUS

## 2021-07-11 MED ORDER — IBUPROFEN 600 MG OR TABS
600.0000 mg | ORAL_TABLET | Freq: Four times a day (QID) | ORAL | 0 refills | Status: DC | PRN
Start: 2021-07-11 — End: 2022-05-18
  Filled 2021-07-11: qty 40, 10d supply, fill #0

## 2021-07-11 MED ORDER — CEFAZOLIN SODIUM-DEXTROSE 2-4 GM/100ML-% IV SOLN
2.0000 g | INTRAVENOUS | Status: DC
Start: 2021-07-11 — End: 2021-07-12
  Filled 2021-07-11: qty 100

## 2021-07-11 MED ORDER — CEFAZOLIN SODIUM 1 G IJ SOLR
INTRAMUSCULAR | Status: DC | PRN
Start: 2021-07-11 — End: 2021-07-11
  Administered 2021-07-11: 3 g via INTRAVENOUS

## 2021-07-11 MED ORDER — PHENYLEPHRINE HCL-NACL 1-0.9 MG/10ML-% IV SOSY
PREFILLED_SYRINGE | INTRAVENOUS | Status: DC | PRN
Start: 2021-07-11 — End: 2021-07-11
  Administered 2021-07-11: 100 ug via INTRAVENOUS

## 2021-07-11 MED ORDER — OXYCODONE HCL 5 MG OR TABS
ORAL_TABLET | ORAL | Status: DC
Start: 2021-07-11 — End: 2021-07-11
  Filled 2021-07-11: qty 1

## 2021-07-11 MED ORDER — SODIUM CHLORIDE 0.9 % IR SOLN
Status: DC | PRN
Start: 2021-07-11 — End: 2021-07-11
  Administered 2021-07-11 (×2): 1000 mL

## 2021-07-11 MED ORDER — ALBUTEROL SULFATE HFA 108 (90 BASE) MCG/ACT IN AERS
INHALATION_SPRAY | RESPIRATORY_TRACT | Status: DC | PRN
Start: 2021-07-11 — End: 2021-07-11
  Administered 2021-07-11: 6 via RESPIRATORY_TRACT

## 2021-07-11 MED ORDER — ONDANSETRON HCL 4 MG/2ML IJ SOLN
8.0000 mg | Freq: Once | INTRAMUSCULAR | Status: AC
Start: 2021-07-11 — End: 2021-07-11
  Administered 2021-07-11: 8 mg via INTRAVENOUS
  Filled 2021-07-11: qty 4

## 2021-07-11 MED ORDER — HYDROMORPHONE HCL 1 MG/ML IJ SOLN
INTRAMUSCULAR | Status: DC | PRN
Start: 2021-07-11 — End: 2021-07-11
  Administered 2021-07-11: 1 mg via INTRAVENOUS

## 2021-07-11 MED ORDER — OXIDIZED CELLULOSE - SURGICEL 2 X 14 PADS
MEDICATED_PAD | CUTANEOUS | Status: AC
Start: 2021-07-11 — End: 2021-07-11
  Filled 2021-07-11: qty 1

## 2021-07-11 MED ORDER — BUPIVACAINE HCL (PF) 0.25 % IJ SOLN
INTRAMUSCULAR | Status: DC | PRN
Start: 2021-07-11 — End: 2021-07-11
  Administered 2021-07-11: 5.5 mL via INTRAMUSCULAR

## 2021-07-11 MED ORDER — LACTATED RINGERS BOLUS
500.0000 mL | Freq: Once | INTRAVENOUS | Status: DC | PRN
Start: 2021-07-11 — End: 2021-07-14

## 2021-07-11 SURGICAL SUPPLY — 34 items
BLADE SURG BARD-PARKER CARBON STEEL 11 (Blade) ×2 IMPLANT
CANNULA LAPARO 5MM 100MM VERSAPORT UNIV STD FIX (Other) ×2 IMPLANT
COVER EQUIP PROXIMA 53INX24IN MAYOSTAND (Drape) ×2 IMPLANT
DRAPE ABDOM PROXIMA 99INX110IN ABSORB REINFORCEMENT FENESTRATE (Drape) ×1
DRAPE ECLIPSE 77INX53IN CUST (Drape) ×2 IMPLANT
DRAPE SURGICAL LAVH STERILE (Drape) ×1 IMPLANT
DRESSING 2 3/4INX2 3/8IN TRANSPARENT (Dressing) ×3
DRESSING TEGADERM 2-3/4INX2-3/8IN TRANSPARENT (Dressing) ×3 IMPLANT
KIT ANTIFOG FOAM RADIOPQ SOL SPONGE LF (Other) ×2 IMPLANT
KIT SURG XL MATERNITY MESH PANT (Dressing) IMPLANT
LINEN PACK (Other) ×2 IMPLANT
NEEDLE HYPODERMIC 25GA 1 1/2IN BD (Needle) ×1
NEEDLE HYPODERMIC 25GA 1-1/2IN BD (Needle) ×1 IMPLANT
NEEDLE INSUFFLATION STEP 14GA 100MM (Other) ×2 IMPLANT
PACK CUST MINOR (Pack) ×2 IMPLANT
PAD SANITARY CURITY HEAVY ABSORB (Other) IMPLANT
POUCH SPEC RETRIEVE GOLD 34.5CM 10MM ENDO CATCH (Other) IMPLANT
SANITARY PAD CURITY HEAVY ABSORB (Other)
SCISSOR ADTEC 5MM 31CM REPOSABLE (Other) IMPLANT
SEAL ENDO SHEATH SCOPE WARMER LAPARO STERILE LF (Laparoscopic) ×2 IMPLANT
SEALER/DIVIDER LAPARO 37CM 5MM LIGASURE 180D 17.8 (Other) IMPLANT
SEALER/DIVIDER LAPARO LIGASURE BLUNT TIP STERILE (Other)
SET 81IN REGULATE CLAMP NONPYROGENIC IRRIG 10 (Tubing)
SET CYSTO BLADDER IRRIG 81IN (Tubing) IMPLANT
SPONGE GAUZE 2INX2IN STERILE 8PLY (Sponge) ×6 IMPLANT
SURGILUBE NONSTERILE (Other) ×2 IMPLANT
SUTURE CAPROSYN 4-0 P-12 18IN UNDYED (Suture) IMPLANT
SUTURE POLYSORB 0 GS-21 36IN UNDYED (Suture) IMPLANT
SYRINGE 5ML LL (Syringe) ×2 IMPLANT
SYRINGE BD 10ML CONTROL (Syringe) ×2 IMPLANT
TRAY SKIN SCRUB 8IN VINYL COTTON 6 WING 6 SPONGE (Pack) IMPLANT
TROCAR VERSAPORT 5MM 100MM STD FIX CANNULA BLADELESS (Blade) ×2 IMPLANT
TUBE INSUFFLATION 10FT .3UM C24- L LL CONNECT (Other) ×2 IMPLANT
WATER STERILE 1000ML PLSTC POUR BOTTLE LF (Solution) ×2 IMPLANT

## 2021-07-11 NOTE — ED Provider Notes (Signed)
CHIEF COMPLAINT   Chief Complaint   Patient presents with   . Vaginal Bleeding   . Urine Problem            HISTORY OF PRESENT ILLNESS   HPI    Ashlee Long is a 26 year old female G2 P0020, currently not on contraception with past medical history PCOS, irregular menses, recent dx PID on abx, schizophrenia, depression, multiple personality disorder, pre-diabetes presenting for persistent severe pelvic pain X 5 days.     Patient states that pain is sharp, stabbing, more right suprapubic area compared to left, 10 out of 10, not improved with Tylenol or ibuprofen or tramadol.  Patient also reports intolerance of metronidazole, with nausea and vomiting despite taking Zofran.  Reports intermittent cold sweats/chills at night.    Patient states that she was given ceftriaxone in the ED, discharged on metronidazole and Doxy 14-day course.  Gonorrhea chlamydia testing negative.  Patient states no improvement of right lower quadrant stabbing pain despite antibiotics.    Additionally patient reports history of irregular menses since 2021. She said she bleeds nearly every day and sometimes passes clots. She had a pelvic ultrasound done on 07/06/21 ED visit to evaluate pelvic structures which was notable for an enlarged R OV cyst approximately 7.6cm versus hydrosalpinx.      Denies fever, headache, chest pain, shortness of breath, upper abdominal pain.  Not taking blood thinners.         PAST MEDICAL AND SURGICAL HISTORY   Past Medical History:   Diagnosis Date   . Depression    . Irregular menses    . 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[place])    . PCOS (polycystic ovarian syndrome)    . Pre-diabetes    . Schizophrenia ([place])               MEDICATIONS AND ALLERGIES     OUTPATIENT MEDICATIONS:   Current Outpatient Medications   Medication Instructions   . albuterol HFA (ProAir HFA) 108 (90 Base) MCG/ACT inhaler Inhale 1 to  2 puffs by mouth every 6 hours as needed for shortness of breath/wheezing.   . metroNIDAZOLE (FLAGYL) 500  mg, Oral, 2 times daily with meals   . metroNIDAZOLE 0.75 % vaginal gel 1 applicatorful, Vaginal, Nightly   . ondansetron (ZOFRAN ODT) 4 mg, Oral, Every 12 hours PRN   . traMADol (ULTRAM) 50 mg, Oral, Every 6 hours PRN       ALLERGIES:   Abilify [aripiprazole]          SOCIAL HISTORY   Social History     Tobacco Use   . Smoking status: Never   Substance Use Topics   . Alcohol use: Yes     Alcohol/week: 2.0 standard drinks     Types: 2 Standard drinks or equivalent per week   . Drug use: Yes     Types: Marijuana              PAST FAMILY HISTORY   Family History     Problem (# of Occurrences) Relation (Name,Age of Onset)    Fibromyalgia (1) Mother             REVIEW OF SYSTEMS   Review of Systems    10/14 Review of Systems completed and negative except as stated above in the HPI (Systems reviewed: HENT, Eyes, Resp, CV, GI, GU, MSK, Skin, Neuro, Psych)        PHYSICAL EXAM   ED VITALS:  Vitals (Arrival)  T: 36.8 C (07/11/21 1303)  BP: (!) 141/62 (07/11/21 1303)  HR: 84 (07/11/21 1303)  RR: 16 (07/11/21 1303)  SpO2: 95 % (07/11/21 1303) Room air   Vitals (Most recent in last 24 hrs)   T: 36.4 C (07/11/21 2039)  BP: 131/65 (07/11/21 2039)  HR: 78 (07/11/21 2039)  RR: 16 (07/11/21 2039)  SpO2: 100 % (07/11/21 2039) Room air  T range: Temp  Min: 36.4 C  Max: 36.8 C  (no weight taken for this visit)     Ht 5' 11"  (1.803 m)     Body mass index is 43.86 kg/m.       Physical Exam  External genitalia unremarkable.  Speculum exam with normal appearing vaginal walls and cervix with os closed.  Vaginal wall mucosa is unremarkable.  Vaginal vault with blood, active bleeding, no clots.  Cervix visualized and is unremarkable (closed in appearance without any protruding material).  Bimanual exam positive for cervical motion tenderness, adnexal tenderness.  No masses appreciated.        LABORATORY:   Labs Reviewed   CBC, DIFF - Abnormal       Result Value    WBC 10.61 (*)     RBC 4.91      Hemoglobin 12.0      Hematocrit 37       MCV 76 (*)     MCH 24.4 (*)     MCHC 32.3      Platelet Count 327      RDW-CV 15.4 (*)     % Neutrophils 55      % Lymphocytes 32      % Monocytes 9      % Eosinophils 3      % Basophils 1      % Immature Granulocytes 0      Neutrophils 5.84      Absolute Lymphocyte Count 3.43      Monocytes 0.94 (*)     Absolute Eosinophil Count 0.31      Basophils 0.05      Immature Granulocytes 0.04      Nucleated RBC 0.00      % Nucleated RBC 0      RBC Morphology See CBC - No additional morphologic findings      Platelet Morphology        Value: See Platelet value - No additional morphologic findings    WBC Morphology See DIFF - No additional morphologic findings     COMPREHENSIVE METABOLIC PANEL    Sodium 099      Potassium 3.7      Chloride 103      Carbon Dioxide, Total 25      Anion Gap 8      Glucose 90      Urea Nitrogen 13      Creatinine 0.67      Protein (Total) 7.4      Albumin 4.2      Bilirubin (Total) 0.3      Calcium 9.1      AST (GOT) 19      Alkaline Phosphatase (Total) 54      ALT (GPT) 15      eGFR by CKD-EPI 2021 >60     MAGNESIUM    Magnesium 1.9     PROTHROMBIN & PTT    Prothrombin Time Patient 13.4      Prothrombin INR 1.0      Partial Thromboplastin Time 27  Partial Thromboplastin X Mean        Value: To calculate the PTT X Mean divide PTT value by 29.   PREGNANCY (HCG), SERUM, QUANT    Pregnancy (HCG), SRM <1     SARS-COV-2 (COVID-19) QUALITATIVE RAPID PCR    COVID-19 Coronavirus Qual PCR Specimen Type Nasal swab      COVID-19 Coronavirus Qual PCR Result        COVID-19 Coronavirus Qual PCR Interpretation        COVID-19 Qualitative PCR Indication Other Asymptomatic           IMAGING:     ED Wet Read -   US Pelvis Complete  W Transvag   Final Result   =========   Compared to exam from 07/06/21, no acute abnormality or interval change in appearance of right adnexal cystic lesion which is favored to represent an ovarian cyst. Follow   up in 6 weeks is recommended to assess for resolution.   The  uterus and ovaries are otherwise normal in appearance normal ovarian Doppler evaluation.          Lupita Shutter Attending                    [place]   I have personally reviewed the images and agree with the report (or as edited).      Salvadore Dom Resident   Electronically signed by Lupita Shutter at 4:42 PM on 07/11/2021          Radiology Final Result -   No image results found.              EKG DOCUMENTATION      No EKG was performed           TRAUMA/STROKE/STEMI ACTIVATION                 SUICIDE RISK EVALUATION                 SEPSIS               4TH  YEAR RESIDENT            ED [place]   ED Course as of 07/11/21 2101   Sun Jul 11, 2021   1545 Pregnancy (HCG), Serum  Negative [CT]   1545 Prothrombin and PTT  Normal [CT]   1545 CBC with Diff(!)  Mild leukocytosis 10.6, no anemia [CT]   1650 Magnesium  Normal [CT]   1650 Comprehensive Metabolic Panel  Normal [CT]   1651 US Pelvis Complete  W Transvag  IMPRESSION  =========  Compared to exam from 07/06/21, no acute abnormality or interval change in appearance of right adnexal cystic lesion which is favored to represent an ovarian cyst. Follow  up in 6 weeks is recommended to assess for resolution.  The uterus and ovaries are otherwise normal in appearance normal ovarian Doppler evaluation.   [CT]   1914 Paged OBGYN [CT]   1702 OBGYN aware, to see patient. [CT]   [id number] OB/GYN assessed the patient, feel surgical exploration for concern of ovarian torsion is indicated.  Patient will go to OR with OB/GYN. [CT]   2008 Signout received from Dr. Donnita Falls at [facility], this is a 26yo woman presenting with vaginal bleeding found to have  [ ]  [CM]      ED Course User Index  [CM] March, Cooper, MD  [CT] Jackquline Berlin, MD       I have  reviewed the relevant external notes from this patient record.   Patient arrives to the ED hemodynamically stable, in no acute distress, nontoxic, afebrile, ambulatory, protecting airway.    Ashlee Long is a 26  year old female G2 P0020, currently not on contraception with past medical history PCOS, irregular menses, recent dx PID on abx, schizophrenia, depression, multiple personality disorder, pre-diabetes presenting for persistent severe pelvic pain X 5 days.     Differential diagnoses include, but not limited to: gastroenteritis; appendicitis; infectious colitis; small or large bowel obstruction; diverticulitis; GERD; gastritis; PUD; mass; pancreatitis; cystitis; kidney pathology (i.e. nephrolithiasis, pyelonephritis); biliary pathology (i.e. cholecystitis, cholangitis); pregnancy; ectopic pregnancy; ovarian torsion; ovarian cysts; pelvic inflammatory disease; ACS; mesenteric ischemia.     Physical examination detailed above.     Patient given Zofran and dilaudid for pain. Labs notable for negative pregnancy, negative troponin, mild leukocytosis 10.6, no anemia, normal electrolytes.    Pelvic US stable compared to prior with right ovarian cyst.    OB/GYN was consulted regarding patient's severe persistent pain, assessed the patient bedside, felt appropriate to take to OR for surgical exploration with concern for ovarian torsion.    Discharge pending OR with OB/GYN.  Strict return precautions will be given at discharge.  Discharge from ED, will send new prescription for clindamycin as patient has failed metronidazole.       CLINICAL IMPRESSION/DISPOSITION   Clinical Impressions:   [R10.2] Pelvic pain in female         Disposition: [place] - ATTENDING ONLY            ADDITIONAL INFORMATION REVIEWED  - ATTENDING ONLY           Jackquline Berlin, MD  Resident  07/11/21 2101    I was present with the resident for the service. I personally verified the history of present illness and performed the physical examination and medical decision making. I have verified all of the resident's documentation for this encounter.     Blair Promise, MD, MPH  Attending Physician  [facility]  Emergency  Medicine         Parke Simmers, MD  07/15/21 [phone]

## 2021-07-11 NOTE — Anesthesia Postprocedure Evaluation (Signed)
Patient: Ashlee Long    Procedure Summary     Date: 07/11/21 Room / Location: [facility] MAIN OR 12 / [facility] MAIN OR    Anesthesia Start: 2102 Anesthesia Stop: 2234    Procedure: DRAINAGE, CYST, OVARY, LAPAROSCOPIC, D&C (Abdomen) Diagnosis:       Pelvic pain in female      Right ovarian cyst      Abnormal uterine bleeding (AUB)      (Pelvic pain in female [R10.2])      (Right ovarian cyst [N83.201])      (Abnormal uterine bleeding (AUB) [N93.9])    Surgeons: Madie Reno, MD Responsible Provider: Don Perking, MD    Anesthesia Type: general ASA Status: 2 - Emergent        Final Anesthesia Type: general    See flow sheet for vital signs     Place of evaluation: PACU    Patient participation: patient participated    Level of consciousness: fully conscious    Patient pain control satisfaction: patient is satisfied with level of pain control    Airway patency: patent    Cardiovascular status during assessment: stable    Respiratory status during assessment: breathing comfortably    Anesthetic complications: no    Intravascular volume status assessment: euvolemic    Nausea / vomiting: patient is not experiencing nausea      Planned post-operative disposition at time of assessment: hospital discharge
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
## 2021-07-11 NOTE — ED Notes (Signed)
Pt ambulatory to stretcher. Pt diagnosed w/ PID last Tuesday. Pt c/o increased pain unrelieved by PO medications prescribed, inability to void, and increased bleeding. Saturating multiple pads every couple of hours, increased from normal. C/O N/V, SOB. Denies diarrhea  and CP.      Roseanne Reno, RN  07/11/21 [phone]

## 2021-07-11 NOTE — Consults (Signed)
Consult Note - Gynecology     Barnett Hatter Talitha Givens") - DOB: 08-19-1995 (26 year old female)  Gender Identity: Female  Preferred Pronouns: she/her/hers  Admit Date: 07/11/2021  Code Status: No Order         Consults    CHIEF CONCERN / IDENTIFICATION:     Ashlee Long is a 26 year old female with known 7cm right ovarian cyst and currently being treated for PID, who presented to the ED with pelvic pain.     SUBJECTIVE   HISTORY OF PRESENT ILLNESS:   Patient was seen in [facility] ED on 2/28 for pelvic pain where she was found to have a 7cm simple right ovarian cyst and also diagnosed with PID. Given her pain was bilateral, it was felt her symptoms were more due to PID rather than torsion, so decision was made to treat for PID then follow up outpatient to discuss management of cyst. She received Ceftriaxone 2/28 and was given rx for Doxy and Flagyl. She was seen in clinic on 3/3 where she had improved pain but still ongoing. She also reported not being able to tolerate the flagyl due to it's taste, so she was instructed to discontinue it. A plan was made to continue treatment for PID and plan for surgery after she concludes treatment.  If her symptoms improved following treatment, then plan was going to be to defer surgery.    Today she states that over the past 24 hours she again had return of more severe pain that gradually worsened through today.  Describes the pain as constant and more localized over her right and midline.  She denies any fevers but does report some intermittent sweats.  Denies any abnormal discharge.    In the ED, she was afebrile and had normal vital signs.  Her labs are notable for a white blood cell count of 10 which is stable from previous.  Transvaginal ultrasound was repeated and was notable for a stable 7 cm simple appearing cyst.  OB/GYN was consulted to discuss further management.            HISTORY   Problem List   Diagnosis   . PID (acute pelvic inflammatory disease)   . Right  ovarian cyst   . Asthma   . Pelvic pain in female   . Abnormal uterine bleeding (AUB)       No past surgical history on file.    Social History     Tobacco Use   . Smoking status: Never   Substance and Sexual Activity   . Alcohol use: Yes     Alcohol/week: 2.0 standard drinks     Types: 2 Standard drinks or equivalent per week   . Drug use: Yes     Types: Marijuana   . Sexual activity: Yes       Family History   Problem Relation Age of Onset   . Fibromyalgia Mother           OB History   # Outcome Date GA Lbr Len/2nd Weight Sex Delivery Anes PTL Lv   2 AB            1 AB                PAST GYNECOLOGIC HISTORY:  Reports a history of abnormal bleeding for a long time.  Has been bleeding for several days straight.  States she had a biopsy in 2019.      MEDICATIONS: {Vanishing Tip  Home  Meds  MAR Summary :999}  Current Outpatient Medications   Medication Instructions   . albuterol HFA (ProAir HFA) 108 (90 Base) MCG/ACT inhaler Inhale 1 to  2 puffs by mouth every 6 hours as needed for shortness of breath/wheezing.   . metroNIDAZOLE (FLAGYL) 500 mg, Oral, 2 times daily with meals   . metroNIDAZOLE 0.75 % vaginal gel 1 applicatorful, Vaginal, Nightly   . ondansetron (ZOFRAN ODT) 4 mg, Oral, Every 12 hours PRN   . traMADol (ULTRAM) 50 mg, Oral, Every 6 hours PRN       ALLERGIES:   Abilify [aripiprazole]     OBJECTIVE     Vitals (Arrival)      T: (not recorded)  BP: (not recorded)  HR: (not recorded)  RR: (not recorded)  SpO2: (not recorded)     Vitals (Most recent in last 24 hrs)   T: (not recorded)  BP: (not recorded)  HR: (not recorded)  RR: (not recorded)  SpO2: (not recorded) Room air  T range: Temp  Min: 36.4 C  Max: 36.8 C  (no weight taken for this visit)     (no height taken for this visit)     There is no height or weight on file to calculate BMI.       PHYSICAL EXAM:  General: healthy, alert, no distress  Head: Normocephalic. No masses, lesions, tenderness or abnormalities  Respiratory effort: Nonlabored  breathing   Neuro:  Grossly normal to observation, gait normal  Psych: alert, oriented to person, place, and time  Pelvic exam: EGBUS within normal limits.  Cervix normal-appearing without abnormal discharge.  Active bleeding from os.  On bimanual exam, uterus is small and anteverted.  There is moderate cervical motion tenderness and uterine tenderness.  Adnexa mildly tender.  Large cyst palpated in right adnexa.    Labs (last 24 hours):   Chemistries  CBC  LFT  Gases, other   136 103 13 90   12.0   AST: 19 ALT: 15  -/-/-/-  -/-/-/-   3.7 25 0.67   10.61 >< 327  AP: 54 T bili: 0.3  Lact (a): - Lact (v): -   eGFR: >60 Ca: 9.1   37   Prot: 7.4 Alb: 4.2  Trop I: - D-dimer: -   Mg: 1.9 PO4: -  ANC: 5.84     BNP: - Anti-Xa: -     ALC: 3.43    INR: 1.0        HCG:   Pregnancy (HCG), SRM (m[IU]/mL)   Date Value   07/11/2021 <1     Pregnancy Test, URN (no units)   Date Value   07/06/2021 Negative       IMAGING:   I have reviewed the latest radiology results     Imaging Results:  US Pelvis Complete  W Transvag  Narrative: Indication  ========  Concern for tubo-ovarian abscess, recent diagnosis PID, not improved with antibiotics       Method  ======  Transabdominal and transvaginal ultrasound examination was performed to evaluate pelvic structures. Color and spectral  Doppler were performed to evaluate right and left ovaries. View: Satisfactory.       Uterus  ======  Uterus: Normal  Uterus details: Normal in size and shape  Uterus position: anteverted  Description of uterine malformations: None  Myometrium: No fibroids seen  Endometrium: within normal limits  Cervix details: normal, Nabothian cyst  Uterus long 51 mm  Uterus ap 35 mm  Uterus tr  41 mm  Uterus Vol 38.3 cm  Endometrial thickness, total 9.0 mm       Right Ovary  =========  Rt ovary details: Single simple cyst seen  Outline: smooth  Rt ovary morphology: premenopausal normal follicular  Rt ovary D1 79 mm  Rt ovary D2 69 mm  Rt ovary D3 72 mm  Rt ovary Vol 205.5  cm  Rt ovarian cyst(s): Cysts identified  Rt ovarian cyst D1 71 mm  Rt ovarian cyst D2 69 mm  Rt ovarian cyst D3 71 mm  Rt ovarian cyst mean 70.3 mm  Rt ovarian cyst vol 182.123 cm  Rt ovarian cyst findings: Unilocular simple cyst  Rt ovarian cysts other findings: Previous measurement 7.2 x 6.8 x 5.4 cm  Rt ovarian Doppler other findings: Venous and arterial spectral flow documented       Left Ovary  ========  Lt ovary details: Appears normal  Outline: smooth  Lt ovary morphology: premenopausal normal follicular  Lt ovary D1 16 mm  Lt ovary D2 23 mm  Lt ovary D3 23 mm  Lt ovary Vol 4.4 cm  Lt ovarian Doppler other findings: Arterial and venous spectral flow documented       Cul de Sac  =========  No free fluid visualized       Impression: =========  Compared to exam from 07/06/21, no acute abnormality or interval change in appearance of right adnexal cystic lesion which is favored to represent an ovarian cyst. Follow  up in 6 weeks is recommended to assess for resolution.  The uterus and ovaries are otherwise normal in appearance normal ovarian Doppler evaluation.       Ashlee Long Attending                    [employer]  I have personally reviewed the images and agree with the report (or as edited).    Salvadore Dom Resident  Electronically signed by Ashlee Long at 4:42 PM on 07/11/2021           ASSESSMENT/PLAN    26 year old G2P0020 being evaluated for torsion vs PID.    #.  Pelvic pain: History as above with ongoing pelvic pain not improving with antibiotics for treatment of PID.  Given patient is presenting again with pain that did not improve with treatment for PID, will plan to proceed to OR for torsion.  -N.p.o.  -Case request placed  -Consent signed for laparoscopic cystectomy possible salpingo-oophorectomy.  We will also do D&C given her ongoing abnormal uterine bleeding and risk factors for endometrial hyperplasia/cancer

## 2021-07-11 NOTE — Op Note (Signed)
Operative Note   Ashlee Long - DOB: 05-23-1995 (26 year old female) MRN: [id number]  Procedure Date: 07/11/2021 Location:[facility] MAIN OR     Preoperative Diagnosis:   Pelvic pain in female  Right ovarian cyst  Suspected intermittent torsion  Abnormal uterine bleeding     Post Operative Diagnosis: Same    Procedures Performed:   Diagnostic laparoscopy  Drainage of R ovarian cyst   Dilation and curettage    Surgeons:     Hyacinth Meeker, Frederica Kuster, MD - Primary     Brooke Dare, MD - Resident - Assisting    Anesthesia:  general  EBL: 20 mL   Specimens: endometrial curettings    Indication(s): Ashlee Long is an 26 year old G31P0020 female with obesity and PCOS as well as recently diagnosed PID who presented to the ED for severe right-sided pelvic pain x5 days. She also reported abnormal uterine bleeding. TVUS showed right sided ovarian cyst and no evidence of current torsion at the time of the exam. She was hemodynamically stable with mild leukocytosis and no other abnnormal findings. Given severe pain of unexplained etiology and presumed intermittent ovarian torsion in setting of large ovarian cyst, she was consented for exam under anesthesia, laparoscopy, ovarian cystectomy, possible salpingo-oophorectomy, dilation and curettage (endometrial sampling).     Finding(s): Speculum exam with normal appearance of vagina, cervix. Uterus small and mobile. Small subserosal fibroid noted anteriorly. Large, simple, very thin-walled right ovarian cyst. Otherwise normal appearance of left ovary, bilateral tubes. Normal abdominal survey. No evidence of abscess. No evidence of peri-hepatic adhesions.Torsion not visualized at time of laparoscopy, but intermittent torsion presumed.     Procedure Details:   The patient was brought to the operating room where general anesthesia was induced without difficulty. She was placed in the dorsal lithotomy position in padded Allen stirrups with her arms tucked at her side in an anatomical  position. The abdomen and vagina were then prepped and draped in the normal sterile fashion. A foley catheter was placed. The anterior lip of the cervix was grasped with a single-toothed tenaculum. The cervix was then serially dilated to 7 mm. A D&C was performed and curettings were collected. A size 7 dilator was attached to the tenaculum with surgical tape and left in place to provide uterine manipulation.    Attention was then turned to the abdomen.  After injection of a few ccs of bupivicaine 0.25%, a 10 mm incision was made inferior to the umbilicus, and a trocar was advanced directly into th eabdomen.  The abdomen was then insufflated. Atraumatic entry was confirmed. The patient was placed into Trendelenburg.  Findings were noted as above. Additional 5 mm trocars were placed in bilateral lower quadrants under direct visualization. Given the simple appearance of the cyst, the cyst was incised and drained. Then, multiple attempts were made to dissect the cyst wall from the ovarian cortex, but these were unsuccessful as the cyst wall was very thin and ruptured easily with minimal pressure. Therefore, attention was turned to hemostasis of the cyst bed, which was achieved with monopolar cautery and then ultimately further confirmed with copious irrigation. Finally, Surgicel was placed into the cyst bed preventatively.     After hemostasis was confirmed, the fascia of the 10 mm port site was closed with 0-Polyosrb. The skin of all incisions was closed with 4-0 Monocryl and steri strips. Sponge, lap and needle counts were correct x2.  The tenaculum, dilator, and Foley were removed. The patient was extubated and  brought to the recovery room in stable condition.      Complications: None.     Condition: stable

## 2021-07-11 NOTE — ED Triage Notes (Signed)
Reports has PID and reports worsening pain and vaginal bleeding, also reports dysuria. Seen at seen at [facility] Tuesday

## 2021-07-11 NOTE — Anesthesia Preprocedure Evaluation (Addendum)
Patient: Ashlee Long    Procedure Information     Date/Time: 07/11/21 1840    Procedure: EXCISION, CYST, OVARY, LAPAROSCOPIC (Abdomen)    Location: [facility] MAIN OR 68 / [place] MAIN OR    Surgeons: Adin Hector, MD        HPI: 26 year old G2P0020 female with obesity and PCOS as well as recently diagnosed PID who presented to the ED for severe right-sided pelvic pain x5 days. She also reported abnormal uterine bleeding. TVUS showed right sided ovarian cyst, no evidence of torsion.     Relevant Problems   Pulmonary   (+) Asthma     Relevant surgical history:       Medications:     Outpatient:   Current Outpatient Medications   Medication Instructions   . albuterol HFA (ProAir HFA) 108 (90 Base) MCG/ACT inhaler Inhale 1 to  2 puffs by mouth every 6 hours as needed for shortness of breath/wheezing.   . metroNIDAZOLE (FLAGYL) 500 mg, Oral, 2 times daily with meals   . metroNIDAZOLE 0.75 % vaginal gel 1 applicatorful, Vaginal, Nightly   . ondansetron (ZOFRAN ODT) 4 mg, Oral, Every 12 hours PRN   . traMADol (ULTRAM) 50 mg, Oral, Every 6 hours PRN        Inpatient:   Scheduled   REM      Continuous  REM      PRN  .  HYDROmorphone, 0.5 mg, q15 min PRN        Review of patient's allergies indicates:  Allergies   Allergen Reactions   . Abilify [Aripiprazole] Seizures       Social History:     Medical History and Review of Systems      Source of information: In person visit.  Previous Anesthesia: No  History of anesthetic complications  (-) family history of anesthetic complications.      Pulmonary Uses inhalers occasionally. Some SOB with walking more than 0.5 mile.   (+) asthma    Cardiovascular   Neg cardio ROS            Physical Exam  Airway  Mallampati:  I  TM distance:  >6 cm  Neck ROM:  Full  Mouth Opening:  Normal  Pre-existing airway:  Intubation    Dental  normal      Cardiovascular  normal      Pulmonary  normal             Labs: (last year)    BMP  CBC/Coags   Na 136 07/11/2021  Hb 12.0 07/11/2021   K 3.7 07/11/2021   HCT 37 07/11/2021   Cl 103 07/11/2021  WBC 10.61 (H) 07/11/2021   HCO3 25 07/11/2021  PLT 327 07/11/2021   BUN 13 07/11/2021  INR 1.0 07/11/2021   Cr 0.67 07/11/2021  PT 13.4 07/11/2021   Glu 90 07/11/2021  PTT 27 07/11/2021       Misc   eGFR >60 07/11/2021  MCV 76 (L) 07/11/2021   A1C - -  BNP - -       LFTs   AST 19 07/11/2021  Albumin 4.2 07/11/2021   ALT 15 07/11/2021  Protein 7.4 07/11/2021   Alk Phos 54 07/11/2021  T Bili 0.3 07/11/2021         Relevant procedures / diagnostic studies:     PAT CLINIC DISCUSSION    ANESTHESIA PLAN   Informed Consent:     Anesthesia Plan discussed with:  Patient    ASA Score:     ASA: 2  Emergent    Planned Anesthetic Type:      general  Supervising Provider Comments:      Discussed risks and benefits of anesthesia.       Risk Calculators / Scores:

## 2021-07-11 NOTE — Anesthesia Procedure Notes (Signed)
Airway Placement    Staff:  Performing Provider: Ludwig Lean, MD  Authorizing Provider: Lysbeth Galas, MD    Airway management:   Patient location: OR/Procedural area  Final airway type: Endotracheal airway  Intubation reason: General anesthesia    Induction:  Positioning: supine  Additional techniques: cricoid pressure  Mask Ventilation: Grade 0 - Ventilation by mask not attempted     Intubation:    Final Attempt   Airway Type: ETT  Primary Laryngoscopy: Macintosh  Blade Size: 3  Laryngoscopic View: Grade I  ETT Type: standard, cuffed  ETT Route: oral  Size: 6.5  ETT secured with adhesive tape  Depth at: teeth  (22cm)  Bite Block: handmade cotton & tape - unilateral    Number of Attempts: 1    Assessment:  Confirmation: waveform capnography, direct visualization and auscultation  Procedure Abandoned: no    Date / Time Airway Secured / Re-Secured:  07/11/2021 9:10 PM

## 2021-07-12 NOTE — Progress Notes (Signed)
ED Social Work Assessment  ID / CC / Reason for Referral: EDSW notified by PACU RN that pt is set to d/c and in need of assistance with d/c transportation.   Identifying data/reason for referral:  Referral Source: Nurse  Referral Reason: Discharge planning    Social Work Summary:  HPI: SW notified by PACU RN that pt is set to d/c from the hospital and in need of assistance with transportation as she has no means of getting herself home.   SW originally attempts to request a Lyft for the pt but when SW meets with the pt in the [place], she confirms that she is not able to ambulate, even short distance at this time. SW informs the pt she will request a cabulance to ensure she makes it home safely. SW requests Medicaid funded [facility] to [address] [place] [address].   Social History:  Living situation prior to admit: Home  Support system: Spouse/significant other    Language: English  Interpreter: No     Impression: Pt d/c'd from the hospital and in need of assistance with transportation home.   Plan: Pt d/c'd in care of self.       Geraldo Docker, LICSW   Emergency Services Social Worker  [employer]  [phone]  SW Emergency department services (minutes): 30

## 2021-07-12 NOTE — Procedure Nursing Note (Signed)
All 3bags belongings returned to patient in PACU A

## 2021-07-12 NOTE — Discharge Instructions (Addendum)
GYNECOLOGY DISCHARGE INSTRUCTIONS    Take augmentin (new antibiotic prescribed today) as prescribed for a 7 day course    Restrictions  Diet: regular diet as tolerated  General activity: as tolerated  Nothing per vagina for 2 weeks  Lifting: less than 10lbs for 6 weeks  No driving while on narcotics or while in pain    Wound Care  Okay to shower. Let soapy water run over your incision(s); do not soak or scrub incision(s)  Keep your incision(s) clean and DRY  Remove bandage from the top of the incision the day after surgery  Remove steristrips from incisions in 7 days if they do not fall off on their own before then    Pain Management  Take acetaminophen (Tylenol) and ibuprofen (Motrin) as prescribed.    Diet: Regular diet as tolerated    Call M.D. If:  Temperature above 101.5 degrees  Vaginal bleeding greater than one pad per hour  Nausea/vomiting and unable to tolerate liquids  Pain not controlled by oral medications  Swelling, pain or tenderness in a calf  Increase in redness, drainage, pus, swelling, warmth, and/or odor from incision/wound  Call 911 if unrelieved chest pain or shortness of breath, nausea vomiting or pain that is not controlled by your oral medications, vaginal bleeding soaking more than 1 pad per hour    Follow Up  Follow up with your surgeon as previously scheduled. If you do not have any appointments scheduled, please call clinic or let one of the OB/GYN residents know so they can help you schedule.  To speak to a provider (clinic during business hours or provider on call outside of business hours), please call [facility]: [phone]

## 2021-07-13 ENCOUNTER — Encounter (HOSPITAL_BASED_OUTPATIENT_CLINIC_OR_DEPARTMENT_OTHER): Payer: Self-pay

## 2021-07-13 LAB — PATHOLOGY, SURGICAL

## 2021-07-20 ENCOUNTER — Telehealth (HOSPITAL_BASED_OUTPATIENT_CLINIC_OR_DEPARTMENT_OTHER): Payer: No Typology Code available for payment source

## 2021-07-21 ENCOUNTER — Encounter (HOSPITAL_BASED_OUTPATIENT_CLINIC_OR_DEPARTMENT_OTHER): Payer: Self-pay | Admitting: Obstetrics & Gynecology

## 2021-07-21 DIAGNOSIS — E282 Polycystic ovarian syndrome: Secondary | ICD-10-CM | POA: Insufficient documentation

## 2021-07-21 LAB — PAP ONLY: Cytologic Impression: UNDETERMINED

## 2021-07-21 LAB — PAP HPV CO-TEST
HPV 16 Genotype: NEGATIVE
HPV 18 Genotype: NEGATIVE
High Risk HPV Screening: POSITIVE — AB
Other High Risk HPV Genotype: POSITIVE

## 2021-07-22 NOTE — Telephone Encounter (Signed)
HPV pos with ASCUS   Pt inquiring about results   My Chart Message to pt

## 2021-07-28 NOTE — Telephone Encounter (Signed)
Called pt as she has not viewed My Chart   Confirmed plan with pt

## 2021-08-03 ENCOUNTER — Encounter (HOSPITAL_COMMUNITY): Admission: EM | Payer: Self-pay | Source: Home / Self Care

## 2021-08-03 DIAGNOSIS — N83201 Unspecified ovarian cyst, right side: Secondary | ICD-10-CM | POA: Insufficient documentation

## 2021-08-03 SURGERY — EXCISION, CYST, OVARY, LAPAROSCOPIC
Anesthesia: General | Site: Abdomen | Laterality: Right

## 2021-08-12 ENCOUNTER — Ambulatory Visit (HOSPITAL_BASED_OUTPATIENT_CLINIC_OR_DEPARTMENT_OTHER): Payer: No Typology Code available for payment source | Admitting: Unknown Physician Specialty

## 2021-08-19 ENCOUNTER — Ambulatory Visit
Payer: No Typology Code available for payment source | Attending: Unknown Physician Specialty | Admitting: Unknown Physician Specialty

## 2021-08-19 VITALS — BP 134/83 | HR 77 | Temp 97.5°F | Wt 308.5 lb

## 2021-08-19 DIAGNOSIS — R87810 Cervical high risk human papillomavirus (HPV) DNA test positive: Secondary | ICD-10-CM | POA: Insufficient documentation

## 2021-08-19 DIAGNOSIS — Z113 Encounter for screening for infections with a predominantly sexual mode of transmission: Secondary | ICD-10-CM | POA: Insufficient documentation

## 2021-08-19 DIAGNOSIS — R87610 Atypical squamous cells of undetermined significance on cytologic smear of cervix (ASC-US): Secondary | ICD-10-CM | POA: Insufficient documentation

## 2021-08-19 DIAGNOSIS — Z48816 Encounter for surgical aftercare following surgery on the genitourinary system: Secondary | ICD-10-CM | POA: Insufficient documentation

## 2021-08-19 LAB — URINE PREGNANCY TEST HCG, ONSITE: Pregnancy (HCG) (UWNC), URN: NEGATIVE

## 2021-08-19 NOTE — Progress Notes (Addendum)
Ashlee Long    ID/CHIEF COMPLAINT   Ashlee Long is a 26 year old G2P0020 with obesity, PCOS, recently diagnosed PID, who presents for a post-op visit and colposcopy.     Subjective    HISTORY OF PRESENT ILLNESS  Ashlee Long presents for:    1) Post-op evaluation following laparoscopy, drainage of R ovarian cyst, D&C for uterine sampling on 07/11/21 with myself and Dr Sabra Heck   - Surgical indication: Patient presented to the ED for severe right-sided pelvic pain x5 days. She also reported abnormal uterine bleeding. TVUS showed right sided ovarian cyst and no evidence of current torsion at the time of the exam. She was hemodynamically stable with mild leukocytosis and no other abnnormal findings.    - Operative findings: Speculum exam with normal appearance of vagina, cervix. Uterus small and mobile. Small subserosal fibroid noted anteriorly. Large, simple, very thin-walled right ovarian cyst. Otherwise normal appearance of left ovary, bilateral tubes. Normal abdominal survey. No evidence of abscess. No evidence of peri-hepatic adhesions.Torsion not visualized at time of laparoscopy, but intermittent torsion presumed.       - Doing well today. Pain has resolved. Bleeding has improved s/p D&C (intermittent light spotting). Tolerating PO, no N/V, voiding spontaneously, having BMs without issue. Incisions are well-healed. She has noted some possibly abnormal discharge and would like full STI testing.            -and-    2) Colposcopy following ASCUS/hrHPV+ (16/18 negative) Pap. No previous abnormal Paps that she recalls. She reports 3 sexual partners in the last year. She is a tobacco user (cigars). She received the HPV vaccine. She is otherwise feeling well today, no other concerns or complaints.     OB/GYN HISTORY   Menses: irregular; known PCOS  Sexually Active: with female partners  Contraception: condoms; previously had mirena IUD   Fertility planning: desires pregnancy  STIs: h/o GC, CT, and  trich  Cervical Cancer Screening: recent ASCUS/hrHPV+ Pap per HPI; unsure of prior results  Gardasil: received x 3 in middle school      OB History   Gravida Para Term Preterm AB Living   2 0 0 0 2 0   SAB IAB Ectopic Multiple Live Births   0 0 0 0 0      # Outcome Date GA Lbr Len/2nd Weight Sex Delivery Anes PTL Lv   2 AB            1 AB                Past Medical Hx, Past Surgical Hx, Family Hx, Social Hx, Medications, and Allergies reviewed with patient and are updated in Epic fields. Problem List also updated in Epic.    Objective    BP 134/83   Pulse 77   Temp 36.4 C (Temporal)   Wt (!) 139.9 kg (308 lb 8 oz)   SpO2 98%   BMI 43.03 kg/m     PROCEDURE NOTE: COLPOSCOPY    HISTORY  Indication: See HPI above.   Allergic to latex: No  Allergic to Iodine or Betadine: No    LAB   A pregnancy test was completed and results are negative     PRE-PROCEDURE  The nature and meaning of PAP smears discussed:  Yes  HPV infection in relation to PAP smear changes discussed:  Yes  Cervical dysplasia and its significance and natural history discussed: Yes  Colposcopy procedure explained:  Yes  Side effects, risks  and complications discussed (including risk of bleeding, infection, non-diagnostic colposcopy result):  Yes    PROCEDURE  Correct patient NAME and ID YES   Correct PROCEDURE YES   Correct EQUIPMENT and SETTINGS YES   Completed CONSENT YES, Date: 08/19/2021     The vulva, vagina, and perianal area were examined with findings of no gross abnormalities .    A speculum was placed and the cervix was painted with acetic acid. The following findings were noted:  Cervix: fully visualized   Squamocolumnar junction: fully visualized   Acetowhite changes: No  Lesion(s) present (acetowhite or other): No    Lesion description:  None    Impression: Normal/Benign findings  Anesthesia: none  Endocervical curettage performed: no  Cervical biopsies: none  Hemostasis: n/a  Procedure performed by Resident physician with Attending  present    Assessment/Plan    ASSESSMENT/PLAN  Ashlee Long is a 26 year old G2P0020 with obesity, PCOS, recently diagnosed PID, who presents for a post-op visit and colposcopy    #. HrHPV+ (16/18 neg)/ASCUS Pap  - Colposcopy performed, as above, with benign findings  - Will need repeat co-test in 1 year (added to colpo tracking pool)    #. Post-op   - Appropriate post-op recovery   - Cleared for return to usual activities    #. STI screening  - Patient desires STI screening, full panel ordered    Follow Up Plan: 1 year or sooner PRN     Napoleon Form, MD  OB/GYN resident PGY3    Patient seen and discussed with Dr. Toma Deiters

## 2021-08-20 LAB — SYPHILIS SCREEN: T. pallidum IgG/IgM Ab (Bioplex): NONREACTIVE

## 2021-08-20 LAB — HEPATITIS C AB WITH REFLEX PCR: Hepatitis C Antibody w/Rflx PCR: NONREACTIVE

## 2021-08-20 LAB — HEPATITIS B SURFACE ANTIGEN W/REFLEX: Hepatitis B Surface Antigen w/Reflex: NONREACTIVE

## 2021-08-20 LAB — HIV ANTIGEN AND ANTIBODY SCRN
HIV Antigen and Antibody Interpretation: NONREACTIVE
HIV Antigen and Antibody Result: NONREACTIVE

## 2021-08-21 ENCOUNTER — Other Ambulatory Visit (HOSPITAL_BASED_OUTPATIENT_CLINIC_OR_DEPARTMENT_OTHER): Payer: Self-pay | Admitting: Unknown Physician Specialty

## 2021-08-21 ENCOUNTER — Other Ambulatory Visit (HOSPITAL_BASED_OUTPATIENT_CLINIC_OR_DEPARTMENT_OTHER): Payer: Self-pay

## 2021-08-21 ENCOUNTER — Encounter (HOSPITAL_BASED_OUTPATIENT_CLINIC_OR_DEPARTMENT_OTHER): Payer: Self-pay | Admitting: Unknown Physician Specialty

## 2021-08-21 DIAGNOSIS — A749 Chlamydial infection, unspecified: Secondary | ICD-10-CM

## 2021-08-21 LAB — GC&CHLAM NUCLEIC ACID DETECTN
Chlam Trachomatis Nucleic Acid: POSITIVE — AB
N.Gonorrhoeae(GC) Nucleic Acid: NEGATIVE

## 2021-08-21 MED ORDER — DOXYCYCLINE MONOHYDRATE 100 MG OR CAPS
100.0000 mg | ORAL_CAPSULE | Freq: Two times a day (BID) | ORAL | 0 refills | Status: DC
Start: 2021-08-21 — End: 2021-08-25
  Filled 2021-08-21: qty 14, 7d supply, fill #0

## 2021-08-21 NOTE — Result Encounter Note (Signed)
Notified pt via MyChart. Rx sent to pharmacy

## 2021-08-21 NOTE — Result Encounter Note (Signed)
Pt notified via My Chart

## 2021-08-23 LAB — TRICHOMONAS NUCLEIC ACID: Trichomonas Nucleic Acid Res.: NEGATIVE

## 2021-08-24 NOTE — Result Encounter Note (Signed)
Notified pt via My Chart.

## 2021-08-25 ENCOUNTER — Other Ambulatory Visit (HOSPITAL_BASED_OUTPATIENT_CLINIC_OR_DEPARTMENT_OTHER): Payer: Self-pay

## 2021-08-25 ENCOUNTER — Ambulatory Visit
Payer: No Typology Code available for payment source | Attending: Obstetrics & Gynecology | Admitting: Student in an Organized Health Care Education/Training Program

## 2021-08-25 ENCOUNTER — Encounter (HOSPITAL_BASED_OUTPATIENT_CLINIC_OR_DEPARTMENT_OTHER): Payer: Self-pay | Admitting: Unknown Physician Specialty

## 2021-08-25 VITALS — BP 117/78 | HR 91 | Temp 98.6°F

## 2021-08-25 DIAGNOSIS — N939 Abnormal uterine and vaginal bleeding, unspecified: Secondary | ICD-10-CM | POA: Insufficient documentation

## 2021-08-25 DIAGNOSIS — A749 Chlamydial infection, unspecified: Secondary | ICD-10-CM | POA: Insufficient documentation

## 2021-08-25 DIAGNOSIS — Z30011 Encounter for initial prescription of contraceptive pills: Secondary | ICD-10-CM | POA: Insufficient documentation

## 2021-08-25 MED ORDER — DOXYCYCLINE MONOHYDRATE 100 MG OR CAPS
100.0000 mg | ORAL_CAPSULE | Freq: Two times a day (BID) | ORAL | 0 refills | Status: AC
Start: 2021-08-25 — End: 2021-09-01
  Filled 2021-08-25: qty 14, 7d supply, fill #0

## 2021-08-25 MED ORDER — DROSPIRENONE 4 MG OR TABS
1.0000 | ORAL_TABLET | Freq: Every day | ORAL | 0 refills | Status: DC
Start: 2021-08-25 — End: 2021-09-07
  Filled 2021-08-25: qty 56, 56d supply, fill #0

## 2021-08-25 NOTE — Progress Notes (Signed)
[facility]  RETURN GYNECOLOGY VISIT    Patient Referred By: No ref. provider found  Patient's PCP: Pcp, Declined    Patient's preferred name: Ashlee Long  Patient's pronouns: she/her       Chief Complaint   Patient presents with   . Follow Up Visit     Pt reports passing blood clots last 48 hours.  Pt had to stop taking iron pills because taking doxyclinie        Subjective:     Ashlee Long is a 26 year old female who presents on 08/25/2021 to discuss bleeding.    During the colposcopy, no abnormalities were noted and cervical biopsies nor ECC were performed.  Notably, she was also recently diagnosed with PID beginning of March and completed treatment course with resolution of symptoms, underwent ovarian cyst drainage for intermittent torsion and D&C for endometrial sampling on 07/11/21 for right sided pelvic pain, and was treated for CT diagnosed at last visit. Has been taking the doxycycline.     Thinks she's been bleeding since 07/28/21, but it's been light at times. For the past 2 days has been passing clots (which is similar to her normal periods). Periods have been heavy since her IUD was removed. Does not like being on birth control. Periods usually prolonged.    Has had TSH check in the past, always normal. Had labs done with [facility] doctor with normal prolactin.    OB HISTORY   G2P0020  OB History     Gravida   2    Para   0    Term   0    Preterm   0    AB   2    Living   0       SAB        IAB        Ectopic        Multiple        Live Births               # Date GA Outcome Labor Sex Weight Anes    1   Abortion            2   Abortion                GYN HISTORY   Menses: irregular; known PCOS  Sexually Active: with female partners  Contraception: condoms; previously had mirena IUD   Fertility planning: desires pregnancy  STIs: h/o GC, CT, and trich  Cervical Cancer Screening: recent ASCUS/hrHPV+ Pap per HPI; unsure of prior results  Gardasil: received x 3 in middle school        Objective:    Vitals: BP 117/78   Pulse 91   Temp 37 C   LMP 07/28/2021   SpO2 99%   Physical Exam  General: healthy, alert, no distress.  Neck: Supple, no adenopathy, thyroid symmetric, normal size, without nodules.  Respiratory: Normal respiratory effort and chest wall movement with respiration.   Psychiatric:  Affect: Appropriate  Neurologic:  Gait:  Normal.       Assessment and Plan:      Ashlee Long is a 26 year old G2P0020 who presents to discuss irregular bleeding in the setting of known PCOS.    Abnormal uterine bleeding (AUB)  Previously diagnosed with PCOS, has had IUD in the past with good control of irregular bleeding. Has had normal labs and endometrial sampling without evidence of other cause of  abnormal bleeding. Current bleeding may be related to recent D&C, but also sounds consistent with heavy and prolonged periods. After discussion, is interested in getting IUD again as she would also like to avoid pregnancy. Discussed timing given recent PID diagnosis and current treatment for chlamydia--most conservative approach is 3 months after PID treatment (completed treatment and symptoms resolved), however she prefers not to wait this long. With shared-decision making, discussed treatment after adequate treatment for chlamydia infection, or one week after completing abx course which will be early May. Will schedule return appointment for IUD placement. Will need repeat GC/CT testing at that visit.    Encounter for initial prescription of contraceptive pills  Would like contraception in the interim. After discussing options, has responded well to [facility] in the past. 3 month prescription sent. Emergency contraception declined.  -     Drospirenone 4 MG tablet; Take 1 tablet by mouth daily.  Dispense: 84 tablet; Refill: 0    Chlamydia infection  On day 5/7 of treatment course. Partner has not been able to get treated yet. Refill sent for partner treatment. Encouraged her to ask him about antibiotic  allergies prior to taking, and that nausea is a common side effect. Reviewed recommendation to abstain from intercourse for 7 days following completion of antibiotic course.  -     doxycycline monohydrate 100 MG capsule; Take 1 capsule (100 mg) by mouth 2 times a day for 7 days. For infection. Take until gone.  Dispense: 14 capsule; Refill: 0    # HCM:  - Cervical cancer screening: due in one year from colpo  - Contraception: Slynd sent, will return for IUD  - STI testing: will need TOC at follow-up    Follow Up Plan: will return for IUD placement, needs GC/CT testing (for test of cure) at that visit    Patient was seen with Dr. Carlyon Shadow, attending physician.     Jeannette Corpus, MD   OB/GYN Resident Physician

## 2021-08-25 NOTE — Telephone Encounter (Addendum)
Pt s/p colpo on 4/13  Reports passing blood clots s/p D+C 3/5  Pt scheduled for appointment today with Dr Katrinka Blazing

## 2021-08-27 ENCOUNTER — Other Ambulatory Visit (HOSPITAL_BASED_OUTPATIENT_CLINIC_OR_DEPARTMENT_OTHER): Payer: Self-pay

## 2021-09-01 ENCOUNTER — Encounter (HOSPITAL_BASED_OUTPATIENT_CLINIC_OR_DEPARTMENT_OTHER): Payer: No Typology Code available for payment source

## 2021-09-07 ENCOUNTER — Encounter (HOSPITAL_BASED_OUTPATIENT_CLINIC_OR_DEPARTMENT_OTHER): Payer: Self-pay

## 2021-09-07 ENCOUNTER — Telehealth (HOSPITAL_BASED_OUTPATIENT_CLINIC_OR_DEPARTMENT_OTHER): Payer: Self-pay | Admitting: Clinical

## 2021-09-07 ENCOUNTER — Ambulatory Visit
Payer: No Typology Code available for payment source | Attending: Obstetrics & Gynecology | Admitting: Obstetrics & Gynecology

## 2021-09-07 VITALS — BP 125/85 | HR 90 | Temp 97.4°F | Wt 307.0 lb

## 2021-09-07 DIAGNOSIS — Z6841 Body Mass Index (BMI) 40.0 and over, adult: Secondary | ICD-10-CM | POA: Insufficient documentation

## 2021-09-07 DIAGNOSIS — N921 Excessive and frequent menstruation with irregular cycle: Secondary | ICD-10-CM | POA: Insufficient documentation

## 2021-09-07 DIAGNOSIS — A749 Chlamydial infection, unspecified: Secondary | ICD-10-CM | POA: Insufficient documentation

## 2021-09-07 DIAGNOSIS — E282 Polycystic ovarian syndrome: Secondary | ICD-10-CM | POA: Insufficient documentation

## 2021-09-07 DIAGNOSIS — Z30430 Encounter for insertion of intrauterine contraceptive device: Secondary | ICD-10-CM | POA: Insufficient documentation

## 2021-09-07 DIAGNOSIS — Z658 Other specified problems related to psychosocial circumstances: Secondary | ICD-10-CM | POA: Insufficient documentation

## 2021-09-07 MED ORDER — LEVONORGESTREL 20 MCG/DAY IU IUD
INTRAUTERINE_SYSTEM | INTRAUTERINE | Status: AC
Start: 2021-09-07 — End: 2021-09-07
  Filled 2021-09-07: qty 1

## 2021-09-07 MED ORDER — LEVONORGESTREL 20 MCG/DAY IU IUD
1.0000 | INTRAUTERINE_SYSTEM | Freq: Once | INTRAUTERINE | Status: AC
Start: 2021-09-07 — End: 2021-09-07
  Administered 2021-09-07: 1
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
## 2021-09-07 NOTE — Addendum Note (Signed)
Addended by: Marcelyn Ditty on: 09/07/2021 08:23 PM     Modules accepted: Orders, Level of Service
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
## 2021-09-07 NOTE — Patient Instructions (Addendum)
To find therapists in your area you can look at the following website  Psychologytoday.[email or web address]      After Your IUD Is Placed:    What can I expect?  It is normal to have unusual bleeding, spotting, and mild cramping for 3 to 6 months after your IUD is placed. These usually improve over time. If your bleeding or cramping is not getting better, or if you have heavy bleeding or strong pelvic pain, call our office right away.   Ibuprofen (Advil, Motrin, and others) helps decrease pain and cramping. You can buy ibuprofen at any drugstore without a prescription. Take 3 tablets (200 mg each) by mouth every 6 hours as needed for pain and cramping. Do not take more than 12 tablets in 24 hours. Be sure to take ibuprofen with food.    What problems should I watch for?   The IUD can sometimes fall out (be expelled from the uterus).   If you have heavy bleeding or pain, check with your fingers to make sure you can still feel the IUD strings. Do not pull on the strings, since you could remove the IUD. If you cannot feel the strings, call our office.   If you are not having symptoms that concern you, you do not need to check your IUD strings.   The IUD is a very good form of birth control, but no form of birth control is perfect. If you have nausea, breast tenderness, pelvic pain, or unexpected bleeding, you may be pregnant. If you have these signs, call our office.  Women who have an IUD can get pelvic infections. Call us right away if you have pain in your pelvis or lower abdomen, unusual vaginal discharge, or a fever higher than 101F (38.3C), if it is not caused by another illness.   See your healthcare provider for yearly exams, and have all routine screening tests. An IUD does not protect against sexually transmitted infections.     When should the IUD be removed?  An IUD can be removed at any time. IUDs are approved by the Food and Drug Administration (FDA) for up to a certain number of years, but in some cases the IUD can be  used for a longer period of time. It is important that you talk about this with your healthcare provider.   Here are the number of years each IUD can be used:  Mirena IUD: 8 years after it is placed (approved by the FDA for 8 years)  Paragard IUD: 10 to 12 years after it is placed (approved by the FDA for 10 years)  Christean Grief IUD: 3 years after it is placed (approved by the FDA for 3 years)  Bhutan IUD: 8 years after it is placed (approved by the FDA for 8 years)  Rutha Bouchard IUD: 5 years after it is placed (approved by the FDA for 5 years)    When should I call my healthcare provider?  Call your healthcare provider if you:  Have heavy bleeding  Feel strong or sharp pain in your pelvis or lower abdomen  Have vaginal discharge that smells bad  Feel pain when you have sex  Cannot find the IUD strings  Have a fever above 101F (38.3C) that you cannot explain  Think you might be pregnant  Want to have the IUD removed    Questions?  Keep these instructions so you can refer to them as needed.  Please call your healthcare provider if you have any questions or concerns  about your IUD.    Instructions for finding a therapist through Psychology Today         1.         Go to [email or web address]    2.         Click "Find a Therapist" and enter your zip code    3.         A list of therapists will appear. Above the first person there is a series of buttons with drop down arrows.  These are search filters.  Select all that apply to narrow your search.    *This includes a filter for your insurance.  Selecting this will help you find someone covered by your plan.            Tips for choosing a Therapist         1.         Stop reviewing options if you begin to feel overwhelmed.  It is better to call or email one therapist than to review 20 and never call anyone.    2.         If you use email to contact them write your concerns and what you are looking for once and copy it.  This can be re-used on the contact form for  many different therapists.  Only therapists with current openings are likely to email you back.    3.         Try to talk to at least two therapists on the phone before starting therapy.  You will discover quickly that you connect with some and not with others.    4.         Balance looking for a great fit with getting started.     5.         Go for someone with as much experience with your personal concern as possible.

## 2021-09-10 ENCOUNTER — Encounter (HOSPITAL_COMMUNITY): Payer: Self-pay

## 2021-09-10 ENCOUNTER — Other Ambulatory Visit: Payer: Self-pay

## 2021-09-10 ENCOUNTER — Emergency Department
Admission: EM | Admit: 2021-09-10 | Discharge: 2021-09-10 | Disposition: A | Discharge status: Home / Self Care | Payer: No Typology Code available for payment source | Attending: Emergency Medical Services | Admitting: Emergency Medical Services

## 2021-09-10 ENCOUNTER — Other Ambulatory Visit (HOSPITAL_BASED_OUTPATIENT_CLINIC_OR_DEPARTMENT_OTHER): Payer: Self-pay

## 2021-09-10 ENCOUNTER — Telehealth (HOSPITAL_BASED_OUTPATIENT_CLINIC_OR_DEPARTMENT_OTHER): Payer: Self-pay | Admitting: Obstetrics & Gynecology

## 2021-09-10 DIAGNOSIS — R1030 Lower abdominal pain, unspecified: Secondary | ICD-10-CM

## 2021-09-10 DIAGNOSIS — Z113 Encounter for screening for infections with a predominantly sexual mode of transmission: Secondary | ICD-10-CM | POA: Insufficient documentation

## 2021-09-10 DIAGNOSIS — N898 Other specified noninflammatory disorders of vagina: Secondary | ICD-10-CM | POA: Insufficient documentation

## 2021-09-10 DIAGNOSIS — R102 Pelvic and perineal pain: Secondary | ICD-10-CM | POA: Insufficient documentation

## 2021-09-10 DIAGNOSIS — A64 Unspecified sexually transmitted disease: Secondary | ICD-10-CM

## 2021-09-10 LAB — URINALYSIS WITH REFLEX CULTURE
Bilirubin (Qual), URN: NEGATIVE
Epith Cells_Renal/Trans,URN: NEGATIVE /HPF
Glucose Qual, URN: NEGATIVE
Ketones, URN: NEGATIVE
Leukocyte Esterase, URN: POSITIVE — AB
Nitrite, URN: NEGATIVE
Specific Gravity, URN: 1.028 g/mL — ABNORMAL HIGH (ref 1.006–1.027)
WBC, URN: NEGATIVE /HPF
pH, URN: 5.5 (ref 5.0–8.0)

## 2021-09-10 LAB — LAB ADD ON ORDER

## 2021-09-10 LAB — PREGNANCY TEST, URINE
Pregnancy Test, URN: NEGATIVE
Specific Gravity, URN: 1.028 g/mL — ABNORMAL HIGH (ref 1.006–1.027)

## 2021-09-10 LAB — REFLEX CULTURE FOR UA

## 2021-09-10 MED ORDER — ACETAMINOPHEN 500 MG OR TABS
1000.0000 mg | ORAL_TABLET | Freq: Once | ORAL | Status: AC
Start: 2021-09-10 — End: 2021-09-10
  Administered 2021-09-10: 1000 mg via ORAL
  Filled 2021-09-10: qty 2

## 2021-09-10 MED ORDER — OXYCODONE HCL 5 MG OR TABS
5.0000 mg | ORAL_TABLET | Freq: Once | ORAL | Status: AC
Start: 2021-09-10 — End: 2021-09-10
  Administered 2021-09-10: 5 mg via ORAL
  Filled 2021-09-10: qty 1

## 2021-09-10 MED ORDER — CEFTRIAXONE SODIUM 500 MG IJ SOLR
500.0000 mg | Freq: Once | INTRAMUSCULAR | Status: AC
Start: 2021-09-10 — End: 2021-09-10
  Administered 2021-09-10: 500 mg via INTRAMUSCULAR
  Filled 2021-09-10: qty 500

## 2021-09-10 MED ORDER — DOXYCYCLINE HYCLATE 100 MG OR TABS
100.0000 mg | ORAL_TABLET | Freq: Two times a day (BID) | ORAL | 0 refills | Status: AC
Start: 2021-09-10 — End: 2021-09-24
  Filled 2021-09-10: qty 28, 14d supply, fill #0

## 2021-09-10 MED ORDER — LIDOCAINE HCL 1 % IJ SOLN
1.8000 mL | Freq: Once | INTRAMUSCULAR | Status: AC
Start: 2021-09-10 — End: 2021-09-10
  Administered 2021-09-10: 1.8 mL
  Filled 2021-09-10: qty 20

## 2021-09-10 MED ORDER — FIRST-METRONIDAZOLE 50 MG/ML OR SUSR
500.0000 mg | Freq: Two times a day (BID) | ORAL | 0 refills | Status: AC
Start: 2021-09-10 — End: 2021-09-24

## 2021-09-10 MED ORDER — KETOROLAC TROMETHAMINE 15 MG/ML IJ SOLN
15.0000 mg | Freq: Once | INTRAMUSCULAR | Status: AC
Start: 2021-09-10 — End: 2021-09-10
  Administered 2021-09-10: 15 mg via INTRAVENOUS
  Filled 2021-09-10: qty 1

## 2021-09-10 MED ORDER — METRONIDAZOLE 500 MG OR TABS
500.0000 mg | ORAL_TABLET | Freq: Two times a day (BID) | ORAL | 0 refills | Status: AC
Start: 2021-09-10 — End: 2021-09-24
  Filled 2021-09-10: qty 28, 14d supply, fill #0

## 2021-09-10 MED ORDER — ONDANSETRON 4 MG OR TBDP
4.0000 mg | ORAL_TABLET | Freq: Two times a day (BID) | ORAL | 0 refills | Status: DC | PRN
Start: 2021-09-10 — End: 2021-10-07

## 2021-09-10 MED ORDER — ONDANSETRON HCL 4 MG/2ML IJ SOLN
8.0000 mg | Freq: Once | INTRAMUSCULAR | Status: AC
Start: 2021-09-10 — End: 2021-09-10
  Administered 2021-09-10: 8 mg via INTRAVENOUS
  Filled 2021-09-10: qty 4

## 2021-09-10 NOTE — ED Provider Notes (Signed)
CHIEF COMPLAINT   Chief Complaint   Patient presents with   . Pelvic Pain            HISTORY OF PRESENT ILLNESS   The patient is a 26 year old with past medical history of schizophrenia not on medications, PCOS disorder, and irregular menses who presents emergency department with suprapubic pain and vaginal discharge.  Patient states that she had a new partner 2 days ago and had an IUD placed on the same day.  Today she has began having pain in her suprapubic part of her stomach, and noticing purulent vaginal discharge.   the pain has been going on since this morning and is progressively been getting worse.  Patient is also noticed scant vaginal discharge that is abnormal for her.  Patient had an IUD placed 2 days ago at OB/GYN clinic and was told that there might be a chance of infection given her recent infection treated not too long ago.  Given patient's sexual practices patient preferred to have IUD placed at that time as it is a greater benefit than the risk of infection.  Patient denies fevers, chills, vomiting, diarrhea, vaginal bleeding, and dysuria.           PAST MEDICAL AND SURGICAL HISTORY     Past Medical History:   Diagnosis Date   . Depression    . Irregular menses    . 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HCC)    . PCOS (polycystic ovarian syndrome)    . Pre-diabetes    . Schizophrenia [facility])                   Past Surgical History:   Procedure Laterality Date   . LAPAROSCOPY DIAGNOSTIC / BIOPSY / ASPIRATION / LYSIS  07/11/2021    drainage of R sided ovarian cyst                   MEDICATIONS AND ALLERGIES     OUTPATIENT MEDICATIONS:   Current Outpatient Medications   Medication Instructions   . Acetaminophen Extra Strength 500-1,000 mg, Oral, Every 6 hours PRN   . albuterol HFA (ProAir HFA) 108 (90 Base) MCG/ACT inhaler Inhale 1 to  2 puffs by mouth every 6 hours as needed for shortness of breath/wheezing.   Marland Kitchen doxycycline hyclate 100 mg, Oral, 2 times daily   . ibuprofen (MOTRIN) 600 mg, Oral, Every 6  hours PRN   . IRON OR Take by mouth.   . metroNIDAZOLE (FLAGYL) 500 mg, Oral, 2 times daily with meals       ALLERGIES:   Abilify [aripiprazole]          SOCIAL HISTORY   Social History     Tobacco Use   . Smoking status: Never   Substance Use Topics   . Alcohol use: Yes     Alcohol/week: 2.0 standard drinks     Types: 2 Standard drinks or equivalent per week   . Drug use: Yes     Types: Marijuana                PAST FAMILY HISTORY   Family History     Problem (# of Occurrences) Relation (Name,Age of Onset)    Fibromyalgia (1) Mother    Bleeding Tendency (2) Mother, Other (mat GGM)                   REVIEW OF SYSTEMS     10/14 Review of Systems completed and negative except as stated  above in the HPI (Systems reviewed: HENT, Eyes, Resp, CV, GI, GU, MSK, Skin, Neuro, Psych)        PHYSICAL EXAM   ED VITALS:  Vitals (Arrival)      T: (!) 35.8 C (09/10/21 1444)  BP: 131/86 (09/10/21 1444)  HR: 95 (09/10/21 1444)  RR: 16 (09/10/21 1444)  SpO2: 98 % (09/10/21 1444) Room air   Vitals (Most recent in last 24 hrs)   T: 36.2 C (09/10/21 1700)  BP: 129/82 (09/10/21 1700)  HR: 90 (09/10/21 1700)  RR: 16 (09/10/21 1700)  SpO2: 99 % (09/10/21 1700) Room air  T range: Temp  Min: 35.8 C  Max: 36.2 C  (no weight taken for this visit)     (no height taken for this visit)     There is no height or weight on file to calculate BMI.     General:  mild acute distress, nontoxic-appearing  HEENT: NC/AT, PERRL, EOMI, MMM   Neck: Normal ROM, trachea midline  CV: RRR, no murmurs appreciated, radial pulses present and equal bilaterally   Pulm: Normal work of breathing, lungs CTAB, no rales/rhonchi/wheezes, no stridor   Abdominal: soft, tender to palpation over suprapubic area, non-distended, no rebound/guarding   Extremities: No obvious bony deformities, no LE edema   Neuro: Alert, oriented to person, place, time, and situation, CN II-XII grossly intact, Spontaneously moving all extremities without focal deficits  GU: No obvious signs of  external lesions on either labia, vaginal wall without signs of trauma, cervix with scant mucopurulent discharge, with erythema consistent with cervicitis, no cervical motion tenderness  Psych: Appropriate mood and congruent affect  Skin: warm, well perfused      LABORATORY:   Labs Reviewed   URINALYSIS WITH REFLEX CULTURE - Abnormal       Result Value    Color, URN Yellow      Clarity, URN Clear      Specific Gravity, URN 1.028 (*)     pH, URN 5.5      Protein (Alb Semiquant), URN Trace (*)     Glucose Qual, URN Negative      Ketones, URN Negative      Bilirubin (Qual), URN Negative      Occult Blood, URN 3+ (*)     Nitrite, URN Negative      Leukocyte Esterase, URN Positive (*)     Urobilinogen, URN 0.1-1.9      Comments for Macroscopic, URN None      Collection Method Information not provided      WBC, URN 0-5(NEG)      RBC, URN 3-5 (1+) (*)     Bacteria, URN Present (*)     Epith Cells_Squamous, URN >5(PRESENT) (*)     Epith Cells_Renal/Trans,URN <3(NEG)      Comments For Microscopic, URN None      Mucus, URN Present (*)     1st Extra Urine [employer] Additional collection tube     PREGNANCY TEST, URINE - Abnormal    Specific Gravity, URN Data entry correction, see updated information      Pregnancy Test, URN Data entry correction, see updated information (*)    PREGNANCY TEST, URINE - Abnormal    Specific Gravity, URN 1.028 (*)     Pregnancy Test, URN Negative     GC&CHLAM NUCLEIC ACID DETECTN    GC&Chlam NA Spec Desc Urine      Chlam Trachomatis Nucleic Acid        N.Gonorrhoeae(GC) Nucleic  Acid       REFLEX CULTURE FOR UA    Reflex Culture for UA Order Processed     LAB ADD ON ORDER    Lab Test Requested urine preg      Specimen Type/Description Urine      Sample To Use Most recent      Test Request Status Order Processed     URINE C/S   TRICHOMONAS WET MOUNT W/REFLEX         IMAGING:     ED Wet Read -   No orders to display       Radiology Final Result -   No image results found.              EKG DOCUMENTATION                TRAUMA/STROKE/STEMI ACTIVATION             SUICIDE RISK EVALUATION             SEPSIS               4TH  YEAR RESIDENT            ED COURSE/MEDICAL DECISION MAKING   On my review of previous records it has recent gynecology visit for which she had IUD placed.  Patient also has previous history of PID and has had multiple STIs in the past.    The patient is a 26 year old female with past medical history of schizophrenia who presents with suprapubic pain, and vaginal discharge. Vitals on presentation were significant for not tachycardic, not hypotensive. My differential diagnosis includes but is not limited to pregnancy, cholecystitis, cholangitis, hepatitis, pancreatitis, mesenteric ischemia, vascular abnormalities, gastritis, diverticulitis, SBO, ovarian torsion, ovarian cyst, PID, TOA, and cystitis. Work up at this time includes urinalysis, urine pregnancy test, and GC chlamydia urine test.  After history and physical exam lower concern for intra-abdominal pathology and more likely related to genitourinary system.  No laboratory evaluation obtained at this time given reassuring vital signs, and reassuring history.     Therapies at this time include Tylenol, ketorolac, Zofran.    ED Course as of 09/10/21 2035   Fri Sep 10, 2021   4196 Urinalysis with leukocyte esterase, no white blood cells, bacteria, and epithelial cells, patient also has occult blood and RBCs however patient has had menstrual bleeding for many months which has not changed.  Unlikely source from urine at this time, bacteria and leukocyte esterase likely secondary to STI. [LC]   1911 Negative urine pregnancy test. [LC]      ED Course User Index  [LC] Sampson Si, MD       See ED course above for my interpretations of studies ordered during this visit. In brief, patient had reassuring urine studies against infection, and a negative pregnancy test.  On physical exam concern for cervicitis and with patient's history of IUD placement,  and pelvic inflammatory disease history patient was treated for pelvic inflammatory disease empirically.  Sent off wet mount test, and GC chlamydia urine test.  Patient received IM ceftriaxone, prescription for Flagyl for 14 days, and doxycycline for 14 days.  Arranged follow-up with patient's OB/GYN clinic.    Patient was discharged home/self-care.  Patient understands and agrees to current treatment plan    Patient care and management discussed with:  OB/GYN for expedited follow-up given history of PID and recent IUD placement.    Social determinant of health affecting diagnosis/treatment:  Mental  health     The following medications were administered in the ED:  Medications   acetaminophen (Tylenol) tablet 1,000 mg (1,000 mg Oral Given 09/10/21 1823)   ketorolac (Toradol) injection 15 mg (15 mg Intravenous Given 09/10/21 1828)   ondansetron (Zofran) injection 8 mg (8 mg Intravenous Given 09/10/21 1828)   oxyCODONE tablet 5 mg (5 mg Oral Given 09/10/21 2014)   cefTRIAXone (Rocephin) injection 500 mg (500 mg Intramuscular Given 09/10/21 2032)   lidocaine (Xylocaine) 1 % injection 1.8 mL (1.8 mL See Admin Instructions Given 09/10/21 2032)       Patient was given scripts for the following medications:  New Prescriptions    DOXYCYCLINE HYCLATE 100 MG TABLET    Take 1 tablet (100 mg) by mouth 2 times a day for 14 days.   Metronidazole 500 mg twice daily      Follow Up:  [facility]  [address]  [place] [id number]  [phone]  In 5 days  If clinic has not called you for an appointment     In my clinical judgment, the patient is safe to complete further workup and management on an outpatient basis.         CLINICAL IMPRESSION/DISPOSITION   Clinical Impressions:   [A64] STI (sexually transmitted infection)   [R10.30] Lower abdominal pain         Disposition: Discharge       CRITICAL CARE - ATTENDING ONLY            ADDITIONAL INFORMATION REVIEWED  - ATTENDING Darlyn ReadNLY           Rayaan Garguilo,  MD  Resident  09/10/21 [phone]

## 2021-09-10 NOTE — Telephone Encounter (Signed)
TC to patient.  She is currently at work until 2 pm, but plans to head to ER after that.  She is experiencing pelvic pain/cramping and a yellow discharge. Symptoms are the same as when she previously had PID. She denies fever, shaking, chills.  Pt was recently diagnosed and treated for chlamydia.  RN agreed with plan for patient to go to the ER and patient will update [place] accordingly.

## 2021-09-10 NOTE — ED Notes (Signed)
Pt came in today from home. Pt had an IUD placed 2 days ago, this is pt 2nd IUD. Pt is having abd cramping and yellow discharge from vagina. Pt AOX4, protecting own airway.      Louellen Molder, RN  09/10/21 [phone]

## 2021-09-10 NOTE — Discharge Instructions (Signed)
You were seen in the emergency room for your abdominal pain.  At this time we believe that it is most likely related to a sexually transmitted infection.  We are covering you to make sure that you do not develop a worsening infection from it.  We have sent off testing today and can call you about your testing to see what therapies your partners may need.  Please contact your OB/GYN to get a clinic appointment scheduled.  If you have worsening abdominal pain, or cannot tolerate your pills please come back to the emergency room for further evaluation.

## 2021-09-10 NOTE — ED Triage Notes (Signed)
Ambulatory to triage, had IUD placed 2 days ago, now having yellow discharge, cervical cramping and vaginal discharge. Had recent diagnosis of PID, took abx, symptoms went away, now they're back.

## 2021-09-10 NOTE — Telephone Encounter (Signed)
RETURN CALL: Voicemail - General Message      SUBJECT:  General Message     MESSAGE: Pt had IUD inserted 09/07/21, vaginal pain. Pt also having yellow discharge but may be from another issue. [facility] attempted warm transfer, staff w/other pts. Pt reporting plan to go to [facility] ER. Please follow up w/pt.

## 2021-09-11 LAB — GC&CHLAM NUCLEIC ACID DETECTN
Chlam Trachomatis Nucleic Acid: NEGATIVE
N.Gonorrhoeae(GC) Nucleic Acid: NEGATIVE

## 2021-09-11 LAB — URINE C/S
Culture: 11000 — AB
Culture: 51000 — AB

## 2021-09-13 ENCOUNTER — Telehealth (HOSPITAL_BASED_OUTPATIENT_CLINIC_OR_DEPARTMENT_OTHER): Payer: Self-pay | Admitting: Obstetrics & Gynecology

## 2021-09-13 LAB — TRICHOMONAS NUCLEIC ACID: Trichomonas Nucleic Acid Res.: NEGATIVE

## 2021-09-13 NOTE — Telephone Encounter (Signed)
TC to pt, appt made for 5/16. Pt reports taking RX Doxy and will fill the Flagyl today. Pt appreciative of call. Will keep 6/1 appt as is in case of further follow up.

## 2021-09-13 NOTE — Telephone Encounter (Signed)
Staff message from Dr Antoine Poche   This patient of ours was seen at [facility] ED for suspected PID after IUD placement. She is electing to keep IUD in place. I was not consulted for management, but was asked to help arrange follow up. Could she have follow up in any open slot in 1-2 weeks? I am cc-ing Dr Barry Dienes, who appears to be aware of this patient.

## 2021-09-14 ENCOUNTER — Encounter (HOSPITAL_BASED_OUTPATIENT_CLINIC_OR_DEPARTMENT_OTHER): Payer: Self-pay | Admitting: Obstetrics & Gynecology

## 2021-09-14 ENCOUNTER — Other Ambulatory Visit (HOSPITAL_BASED_OUTPATIENT_CLINIC_OR_DEPARTMENT_OTHER): Payer: Self-pay | Admitting: Obstetrics & Gynecology

## 2021-09-14 DIAGNOSIS — Z6841 Body Mass Index (BMI) 40.0 and over, adult: Secondary | ICD-10-CM

## 2021-09-15 ENCOUNTER — Encounter (HOSPITAL_BASED_OUTPATIENT_CLINIC_OR_DEPARTMENT_OTHER): Payer: Self-pay

## 2021-09-20 ENCOUNTER — Telehealth (HOSPITAL_BASED_OUTPATIENT_CLINIC_OR_DEPARTMENT_OTHER): Payer: Self-pay | Admitting: Clinical

## 2021-09-20 NOTE — Telephone Encounter (Signed)
SW called patient to f/u mental health resources provided 2 weeks ago by phone and [place].  Patient engaged well with SW stating she would like to restart her medications for mental health care.  SW explained that the resources SW sent her previously for therapy would be for medication management as well ([place] or [facility] would have the closest locations to patient).  Patient located MyChart message with contact information for these agencies and said she will call today.  Patient has a naturopath for primary care so she does not prescribe mental health meds per patient.  SW encouraged patient to call SW back as needed if she needs additional resources/support.  Patient reports no additional sw needs at this time.    Jillene Bucks, [place]

## 2021-09-21 ENCOUNTER — Ambulatory Visit (HOSPITAL_BASED_OUTPATIENT_CLINIC_OR_DEPARTMENT_OTHER): Payer: No Typology Code available for payment source | Admitting: Obstetrics & Gynecology

## 2021-09-21 ENCOUNTER — Encounter (HOSPITAL_BASED_OUTPATIENT_CLINIC_OR_DEPARTMENT_OTHER): Payer: Self-pay

## 2021-09-21 NOTE — Progress Notes (Signed)
Ashlee Long did not cancel and was not present for a scheduled appointment today.  Disposition: Chart reviewed, patient contacted by [facility] regarding follow-up

## 2021-09-24 IMAGING — CT CT HEAD W/O CM
3 series · 15 of 47 positions shown, 18 images · non-contrast
Comparison: None.

CLINICAL DATA: Fell at work with trauma to the head and neck.

EXAM:
CT HEAD WITHOUT CONTRAST
CT CERVICAL SPINE WITHOUT CONTRAST
TECHNIQUE: Multidetector CT imaging of the head and cervical spine was
performed following the standard protocol without intravenous
contrast. Multiplanar CT image reconstructions of the cervical spine
were also generated.

[Series 2: head wo · axial · 0.47mm/px · z∈[-163,-23]mm · 9 of 34 slices shown, 12 images]
[im 3/34  brain]
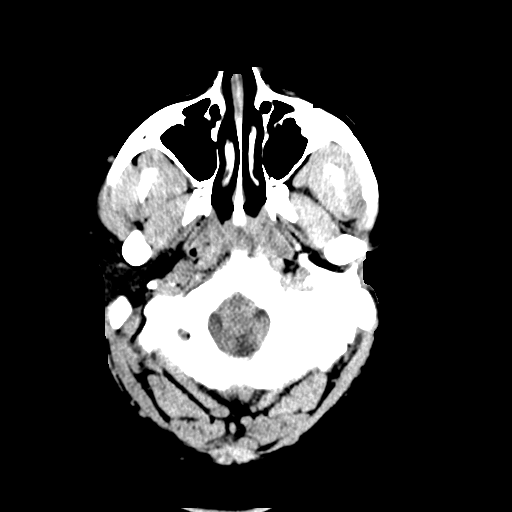
[im 3/34  bone]
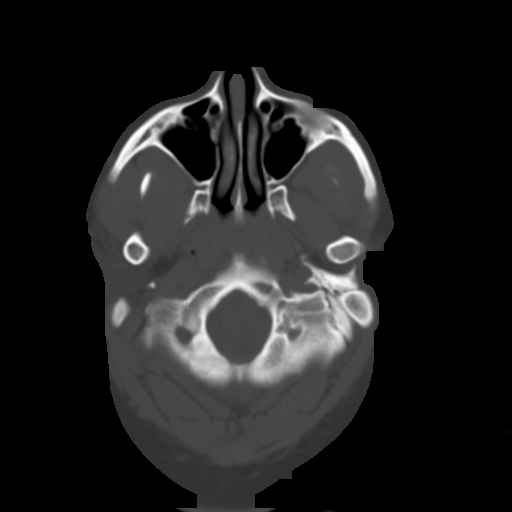
[im 6/34  brain]
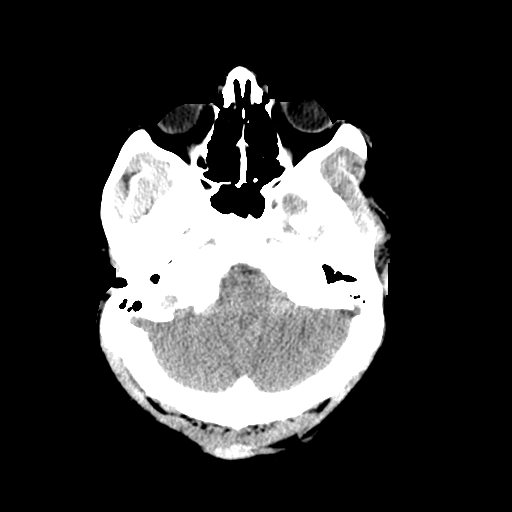
[im 10/34  brain]
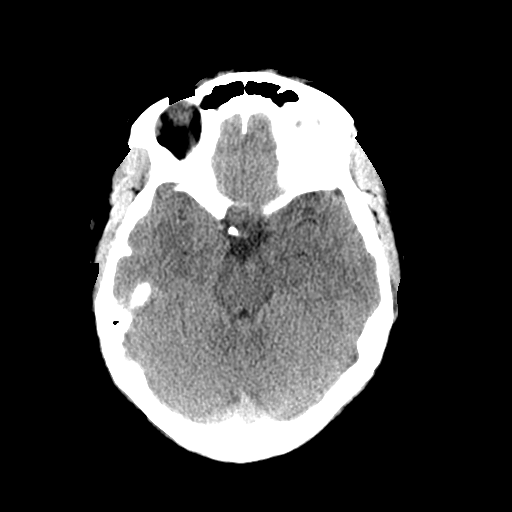
[im 13/34  brain]
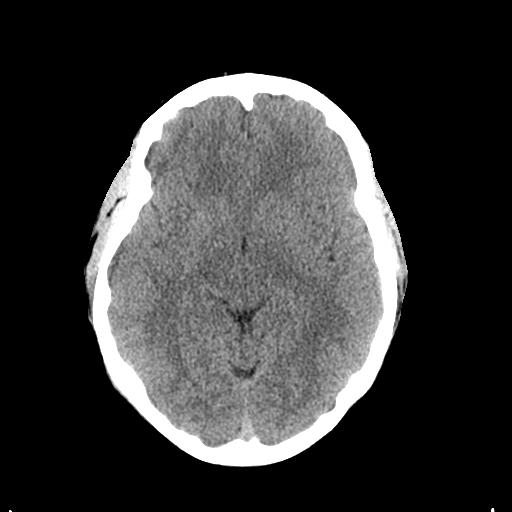
[im 18/34  brain]
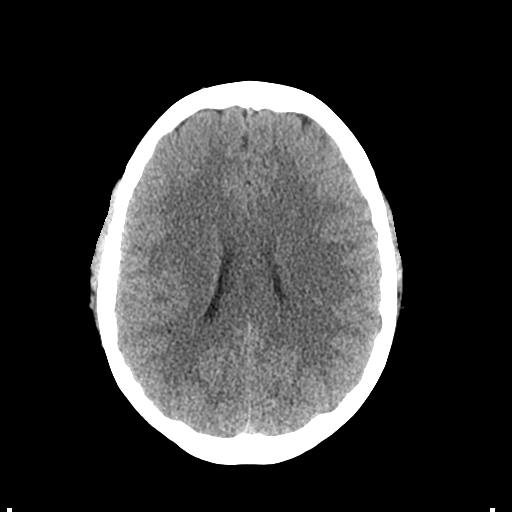
[im 18/34  bone]
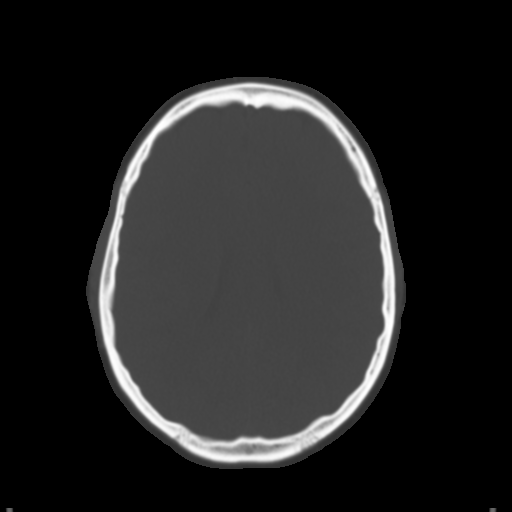
[im 21/34  brain]
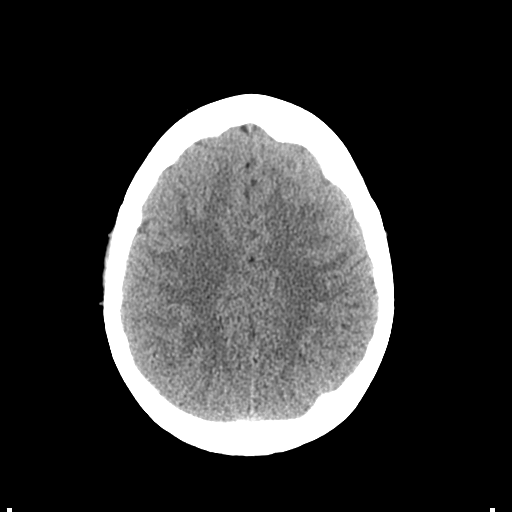
[im 24/34  brain]
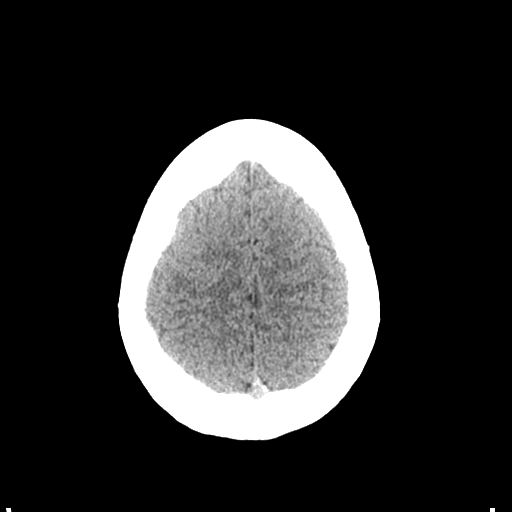
[im 28/34  brain]
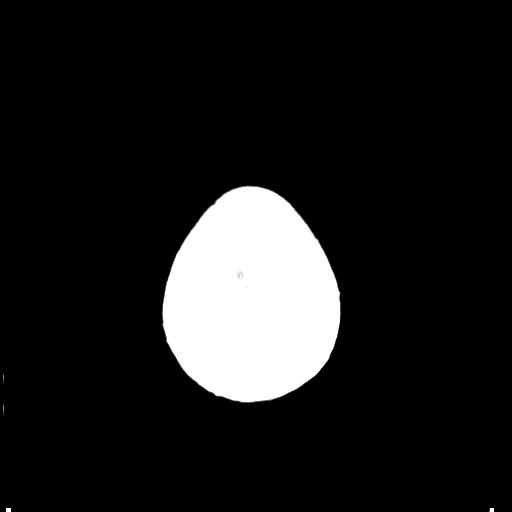
[im 31/34  brain]
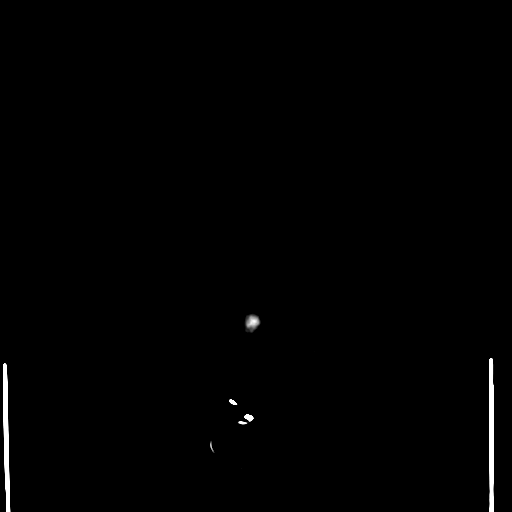
[im 31/34  bone]
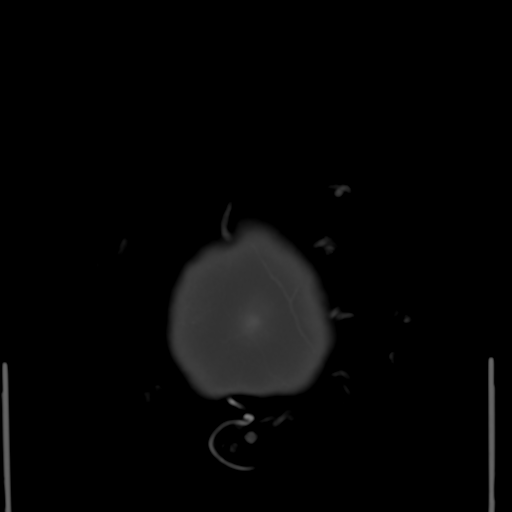

[Series 4: coronal soft tissue · coronal · 0.33mm/px · 3 of 68 slices shown]
[im 23/68  brain]
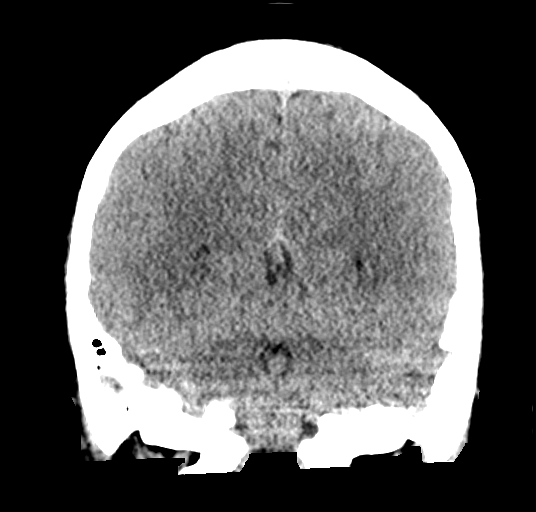
[im 30/68  brain]
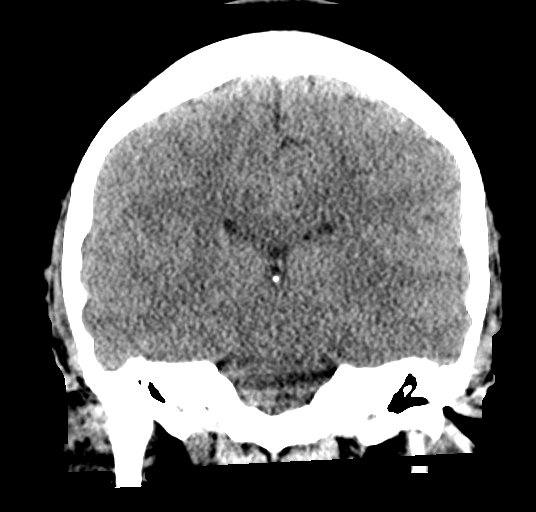
[im 38/68  brain]
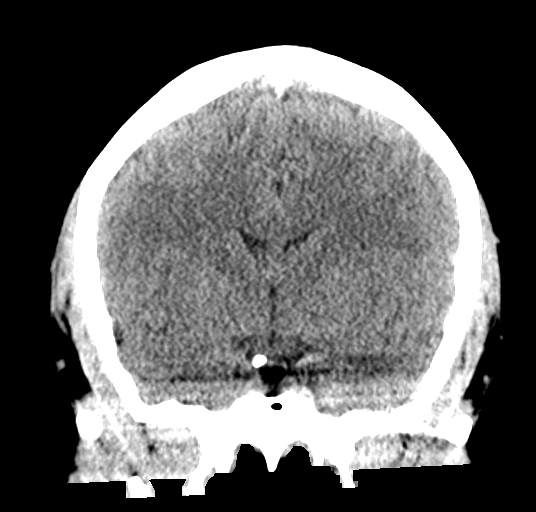

[Series 5: sagittal soft tissue · sagittal · 0.33mm/px · 3 of 60 slices shown]
[im 20/60  brain]
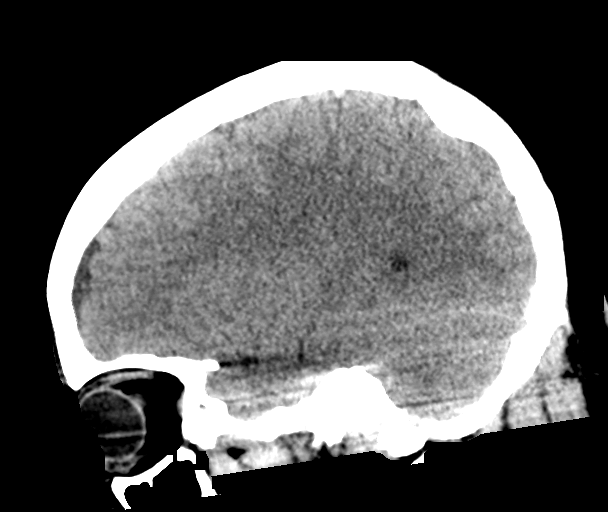
[im 30/60  brain]
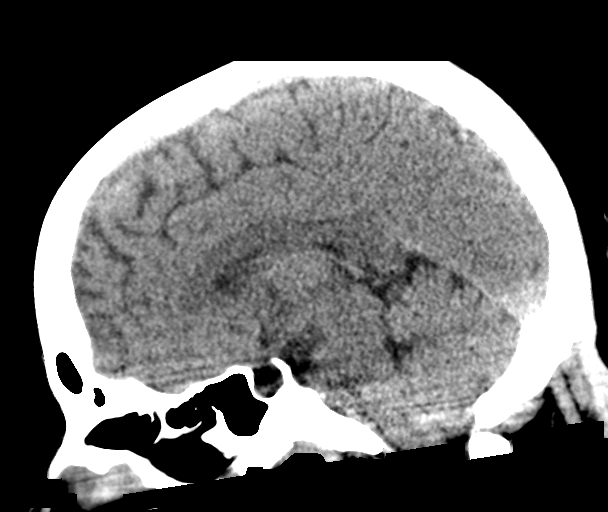
[im 40/60  brain]
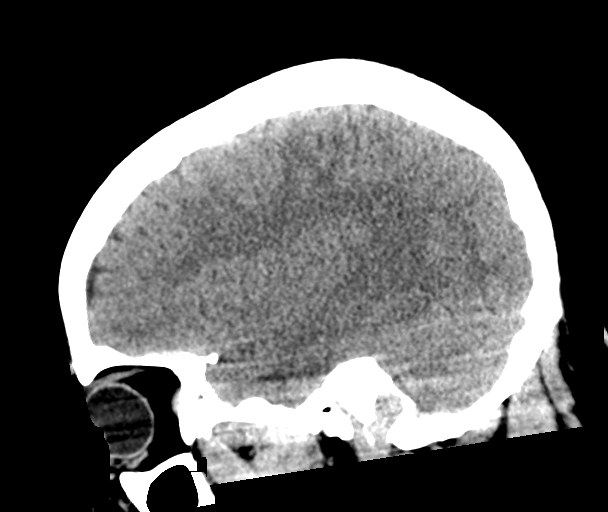

[15 of 47 positions shown; findings below may reference images not displayed]

FINDINGS: CT HEAD FINDINGS

Brain: The brain shows a normal appearance without evidence of
malformation, atrophy, old or acute small or large vessel
infarction, mass lesion, hemorrhage, hydrocephalus or extra-axial
collection.

Vascular: No hyperdense vessel. No evidence of atherosclerotic
calcification.

Skull: Normal.  No traumatic finding.  No focal bone lesion.

Sinuses/Orbits: Sinuses are clear. Orbits appear normal. Mastoids
are clear.

Other: None significant

CT CERVICAL SPINE FINDINGS

Alignment: No traumatic malalignment. Straightening of the normal
cervical lordosis, likely positional.

Skull base and vertebrae: No regional fracture or focal bone lesion.

Soft tissues and spinal canal: Normal

Disc levels: No evidence of degenerative disc disease or
degenerative facet disease. No canal or foraminal stenosis.

Upper chest: Negative

Other: None
IMPRESSION: Head CT: Normal.

Cervical spine CT: Normal.

## 2021-10-07 ENCOUNTER — Ambulatory Visit
Payer: No Typology Code available for payment source | Attending: Obstetrics & Gynecology | Admitting: Student in an Organized Health Care Education/Training Program

## 2021-10-07 ENCOUNTER — Other Ambulatory Visit (HOSPITAL_BASED_OUTPATIENT_CLINIC_OR_DEPARTMENT_OTHER): Payer: Self-pay

## 2021-10-07 ENCOUNTER — Other Ambulatory Visit (HOSPITAL_BASED_OUTPATIENT_CLINIC_OR_DEPARTMENT_OTHER): Payer: Self-pay | Admitting: Obstetrics & Gynecology

## 2021-10-07 VITALS — BP 135/84 | HR 86 | Temp 97.8°F | Wt 306.0 lb

## 2021-10-07 DIAGNOSIS — Z113 Encounter for screening for infections with a predominantly sexual mode of transmission: Secondary | ICD-10-CM | POA: Insufficient documentation

## 2021-10-07 DIAGNOSIS — Z8619 Personal history of other infectious and parasitic diseases: Secondary | ICD-10-CM | POA: Insufficient documentation

## 2021-10-07 DIAGNOSIS — N921 Excessive and frequent menstruation with irregular cycle: Secondary | ICD-10-CM

## 2021-10-07 DIAGNOSIS — E282 Polycystic ovarian syndrome: Secondary | ICD-10-CM

## 2021-10-07 DIAGNOSIS — Z6841 Body Mass Index (BMI) 40.0 and over, adult: Secondary | ICD-10-CM

## 2021-10-07 DIAGNOSIS — N898 Other specified noninflammatory disorders of vagina: Secondary | ICD-10-CM | POA: Insufficient documentation

## 2021-10-07 LAB — LIPID PANEL
Cholesterol/HDL Ratio: 4.2
HDL Cholesterol: 40 mg/dL (ref >39)
LDL Cholesterol, NIH Equation: 89 mg/dL (ref <130)
Non-HDL Cholesterol: 128 mg/dL (ref 0–159)
Total Cholesterol: 168 mg/dL (ref <200)
Triglyceride: 232 mg/dL — ABNORMAL HIGH (ref <150)

## 2021-10-07 LAB — FERRITIN: Ferritin: 5 ng/mL — ABNORMAL LOW (ref 10–180)

## 2021-10-07 LAB — VITAMIN B12 (COBALAMIN): Vitamin B12 (Cobalamin): 225 pg/mL (ref 180–914)

## 2021-10-07 LAB — TSH WITH REFLEXIVE FREE T4: TSH with Reflexive Free T4: 4.814 u[IU]/mL (ref 0.400–5.000)

## 2021-10-07 NOTE — Progress Notes (Addendum)
[facility]  RETURN GYNECOLOGY VISIT    Patient Referred By: No ref. provider found  Patient's PCP: Pcp, Declined    Patient's preferred name: Ashlee Long  Patient's pronouns: she/her      Chief Complaint   Patient presents with    Vaginal Problem     IUD check, feel like still has yeast infection sx        Subjective:     Ashlee Long is a 26 year old female who presents on 09/07/2021 for HMB, seeking mirena IUD placement.    Last seen on 09/07/21 by Dr. Nelly Laurence regarding AUB/HM and chlamydia at which point she had an IUD placed. A few months ago she had chlamydia, planning for TOC on 7/13. Right now she has ongoing discharge, feels kind of like she has BV. No itching. Discharge is white and sticky, has a foul odor. She also general light pain in her pelvis, no pain with urination.     As far as her bleeding goes, she has stopped! She is very happy about that. The IUD is working well for that so she is very happy.     She has no other concerns but is interested in doing STI screening. Has a new partner and they both want to get tested.       OB HISTORY   G2P0 ( abortion 2014 and 2018 s/p D&C x2)   OB History       Gravida   2    Para   0    Term   0    Preterm   0    AB   2    Living   0         SAB        IAB        Ectopic        Multiple        Live Births               # Date GA Outcome Labor Sex Weight Anes    1   Abortion            2   Abortion                    GYN HISTORY   Menstrual Hx: History of HMB since age 26   Sexual history: sexually active   Cervical cancer screening hx: see HPI, last pap in 07/09/2021 HPV other/ASCUS s/p colposcopy with no biopsy or ECC   Contraception: Mirena IUD  Fertility plans: Desires   History of STIs: yes chlamydia on 08/19/21  History of trauma or severe pain with pelvic exams: YES  GYN surgery: s/p D&C x3        Objective:    Vitals: BP 135/84   Pulse 86   Temp 36.6 C (Temporal)   Wt (!) 138.8 kg (306 lb)   SpO2 96%   BMI 42.68 kg/m   Physical Exam  General:  healthy, alert, no distress.  Cardiac: WWP  Respiratory: Normal respiratory effort and chest wall movement with respiration.   Abdomen: no lower abdominal tenderness   Psychiatric:  Affect: Appropriate  Neurologic:  Gait:  Normal.  Skin: Skin color, texture, turgor normal. No rashes or concerning lesions on visible areas.  Pelvic Exam: External genitalia normal, normal bartholin/skene/urethral meatus/anus., Vagina is rugated and well-estrogenized, cervix normal in appearance with IUD strings visible. No abnormal discharge    Exam chaperoned by  medical assistant and Dr. Claudie Fisherman'    Wet mount negative, no yeast or clue cells seen        Assessment and Plan:    Ashlee Long is a 26 year old G2P0 who presents for follow up of AUB and abnormal discharge     #. AUB: long history of heavy menstrual bleeding with normal imaging and EMB. Mirena IUD placed 4 weeks ago with resolution of bleeding, IUD strings visible on exam  - Continue Mirena IUD     #. Abnormal discharge: no discharge on exam, negative wet mount. Discussed that it could be change in physiologic discharge in the setting of new IUD. Will continue to monitor symptoms.     #. Polycystic ovary syndrome  #. Seeking Weight Loss    - Patient has questions regarding Ozepmic for weight loss in light of history of pre-diabetes while she was in [place] and strong family history of diabetes. Will check A1c today. She is interested in weight loss surgery, does not currently have PCC  - A1c  - Referral to internal medicine     #. Chlamydia infection  - Given patient's most recent Chlamydia diagnosis was on 08/19/2021, will need test of cure specimen on 11/18/2021 or afterwards- too soon for test of reinfection now. She has a new partner and interested in getting STI screening today  - GC, trich, HIV, RPR, Hep B, Hep C today  - Wrote to lab to not run chlamydia given that it is too soon for test of cure     #. HCM:  - Cervical cancer screening: HPV + (other)/ASCUS  07/2021, Colposcopy on 4/13 with no abnormal lesions, repeat co-test due 08/2022  - HPV Vaccine: rec'd  - Contraception: Mirena IUD  - STI testing: plan for TOC chlymadia on 11/18/2021    Follow Up Plan: 7/13 G/C ordered for TOC Chlamydia    Patient was seen with Dr. Claudie Fisherman, attending physician.     Ansel Bong, MD  [facility] Resident

## 2021-10-08 LAB — SYPHILIS SCREEN: T. pallidum IgG/IgM Ab (Bioplex): NONREACTIVE

## 2021-10-08 LAB — FIBRINOGEN: Fibrinogen: 451 mg/dL — ABNORMAL HIGH (ref 150–450)

## 2021-10-08 LAB — HEPATITIS B SURFACE ANTIGEN W/REFLEX: Hepatitis B Surface Antigen w/Reflex: NONREACTIVE

## 2021-10-08 LAB — HIV ANTIGEN AND ANTIBODY SCRN
HIV Antigen and Antibody Interpretation: NONREACTIVE
HIV Antigen and Antibody Result: NONREACTIVE

## 2021-10-08 LAB — VON WILLEBRAND FACTOR ANTIGEN: Von Willebrand Factor Ag: 164 % (ref 50–200)

## 2021-10-08 LAB — VWF ACTIVITY: VWF Activity: 147 % (ref 50–200)

## 2021-10-08 LAB — FACTOR VIII ACTIVITY: Factor VIII Activity: 213 % — ABNORMAL HIGH (ref 50–200)

## 2021-10-08 LAB — HEMOGLOBIN A1C, HPLC: Hemoglobin A1C: 5.6 % (ref 4.0–6.0)

## 2021-10-08 LAB — HEPATITIS C AB WITH REFLEX PCR: Hepatitis C Antibody w/Rflx PCR: NONREACTIVE

## 2021-10-11 ENCOUNTER — Other Ambulatory Visit (HOSPITAL_BASED_OUTPATIENT_CLINIC_OR_DEPARTMENT_OTHER): Payer: Self-pay

## 2021-10-11 DIAGNOSIS — N921 Excessive and frequent menstruation with irregular cycle: Secondary | ICD-10-CM

## 2021-10-11 LAB — VITAMIN D (25 HYDROXY)
Vit D (25_Hydroxy) Total: 18.2 ng/mL — ABNORMAL LOW (ref 20.1–50.0)
Vitamin D2 (25_Hydroxy): 2.1 ng/mL
Vitamin D3 (25_Hydroxy): 16.1 ng/mL

## 2021-10-11 MED ORDER — ASCORBIC ACID 500 MG OR TABS
500.0000 mg | ORAL_TABLET | ORAL | 0 refills | Status: AC
Start: 2021-10-11 — End: 2022-01-09
  Filled 2021-10-11: qty 45, 90d supply, fill #0

## 2021-10-11 MED ORDER — FERROUS SULFATE 324 (65 FE) MG OR TBEC
324.0000 mg | DELAYED_RELEASE_TABLET | ORAL | 0 refills | Status: DC
Start: 2021-10-11 — End: 2022-05-12
  Filled 2021-10-11: qty 15, 30d supply, fill #0
  Filled 2022-03-14: qty 15, 30d supply, fill #1

## 2021-10-11 NOTE — Progress Notes (Signed)
Ordered CBC and ferritin to be performed after 1 month of taking oral iron pills. Communicated to patient via [place].

## 2021-10-11 NOTE — Result Encounter Note (Signed)
Ordered cbc and ferritin to be performed 1 month after taking oral iron. Communicated to patient via [place].

## 2021-10-11 NOTE — Result Encounter Note (Signed)
Normal vWF activity, low ferritin. Will Rx iron + vitamin C every other day x3 months.

## 2021-10-11 NOTE — Progress Notes (Signed)
Rx for iron + vitamin C sent to [facility] pharmacy.

## 2021-10-12 LAB — GC&CHLAM NUCLEIC ACID DETECTN
Chlam Trachomatis Nucleic Acid: NEGATIVE
N.Gonorrhoeae(GC) Nucleic Acid: NEGATIVE

## 2021-10-13 ENCOUNTER — Other Ambulatory Visit (HOSPITAL_BASED_OUTPATIENT_CLINIC_OR_DEPARTMENT_OTHER): Payer: Self-pay

## 2021-10-13 ENCOUNTER — Other Ambulatory Visit (HOSPITAL_COMMUNITY): Payer: Self-pay | Admitting: Obstetrics & Gynecology

## 2021-10-13 DIAGNOSIS — E559 Vitamin D deficiency, unspecified: Secondary | ICD-10-CM

## 2021-10-13 MED ORDER — CHOLECALCIFEROL 50 MCG (2000 UT) OR CAPS
2000.0000 [IU] | ORAL_CAPSULE | Freq: Every day | ORAL | 3 refills | Status: DC
Start: 2021-10-13 — End: 2022-05-18
  Filled 2021-10-13 – 2022-03-14 (×2): qty 90, 90d supply, fill #0

## 2021-10-13 NOTE — Result Encounter Note (Signed)
Supplement vit D, recheck 1 mo

## 2021-10-15 LAB — TRICHOMONAS NUCLEIC ACID: Trichomonas Nucleic Acid Res.: NEGATIVE

## 2021-11-02 ENCOUNTER — Encounter (HOSPITAL_BASED_OUTPATIENT_CLINIC_OR_DEPARTMENT_OTHER): Payer: Self-pay

## 2021-12-02 ENCOUNTER — Encounter (HOSPITAL_BASED_OUTPATIENT_CLINIC_OR_DEPARTMENT_OTHER): Payer: Self-pay | Admitting: Student in an Organized Health Care Education/Training Program

## 2021-12-02 ENCOUNTER — Ambulatory Visit (HOSPITAL_BASED_OUTPATIENT_CLINIC_OR_DEPARTMENT_OTHER)
Payer: No Typology Code available for payment source | Admitting: Student in an Organized Health Care Education/Training Program

## 2021-12-02 NOTE — Progress Notes (Deleted)
[facility] - Internal Medicine Clinic Note      ID/CC:    Ashlee Long is a 26 year old female who presents today to establish care and for opinion and advice regarding:  ***    #Mental Health  Dot Lanes spoke regarding resources  How is that going  Safety?    #Weight loss  Was interested in medication and referred to weight loss center    #IDA  How are pills going?  Due for recheck- labs ordered by gyn        Past Surgical History:   Procedure Laterality Date    LAPAROSCOPY DIAGNOSTIC / BIOPSY / ASPIRATION / LYSIS  07/11/2021    drainage of R sided ovarian cyst    NO PRIOR SURGERIES           (established PFSH:  Detailed : 1 item, Complete >2)    Family History       Problem (# of Occurrences) Relation (Name,Age of Onset)    Heart Disease (1) Maternal Grandmother (Debra p)    Diabetes (2) Maternal Grandmother (Debra p), Maternal Aunt Carolina Sink)    Substance Abuse (2) Maternal Aunt (Nellie l), Maternal Uncle (William p)    Fibromyalgia (1) Mother    Bleeding Tendency (2) Mother, Other (mat GGM)            Social Hx:    Social History     Socioeconomic History    Marital status: Legally Separated   Tobacco Use    Smoking status: Never   Substance and Sexual Activity    Alcohol use: Yes     Alcohol/week: 2.0 standard drinks     Types: 2 Standard drinks or equivalent per week    Drug use: Yes     Types: Marijuana    Sexual activity: Yes     Partners: Female     Birth control/protection: I.U.D.     ***      Review of Systems  A complete review of systems was performed using form [id number] and was reviewed with the patient, it is scanned into the chart. Pertinent positives are noted and discussed in  HPI.    Physical Exam 1997 (Expanded Problem Focused 6 elements, Detailed 12 elements, Comprehensive All constitutional, GI, 7 in gyn, at least one other system.    Physical Exam  There were no vitals taken for this visit.  GEN: no acute distress  HEENT: normal TMs, anicteric sclerae, OP clear, no thryomegaly or  palpable lumps  LYMPH: no cervical, supraclavicular or axillary adenopathy bilaterally  ***BREAST: breasts symmetric, no skin retraction or dimpling, no dominant mass or lump on palpation, no nipple discharge   CV: rrr, no r/m/g  PULM: clear bilaterally, no wheeze, rales, ronchi  ABD: soft, normal bowel sounds, non-tender  ***GYN: Normal external genitals, normal bartholins and scenes glands, vagina is normal in appearance with normal mucosa, cervix is non-tender without lesions or discharge  EXT: warm, well perfused, no edema  PSYCH: normal affect  NEURO: normal gait, alert        Ashlee Long is a 26 year old female presenting for:     There are no diagnoses linked to this encounter.              Answers submitted by the patient for this visit:  Health Screening: Depression (Submitted on 11/30/2021)  PHQ2 Score: 6  Health Screening: Depression (Submitted on 11/30/2021)  PHQ9 SCORE: 16

## 2021-12-06 ENCOUNTER — Other Ambulatory Visit: Payer: Self-pay

## 2021-12-06 ENCOUNTER — Emergency Department
Admission: EM | Admit: 2021-12-06 | Discharge: 2021-12-06 | Disposition: A | Discharge status: Home / Self Care | Payer: No Typology Code available for payment source | Attending: Emergency Medical Services | Admitting: Emergency Medical Services

## 2021-12-06 ENCOUNTER — Encounter (HOSPITAL_COMMUNITY): Payer: Self-pay

## 2021-12-06 DIAGNOSIS — J069 Acute upper respiratory infection, unspecified: Secondary | ICD-10-CM | POA: Insufficient documentation

## 2021-12-06 DIAGNOSIS — Z20822 Contact with and (suspected) exposure to covid-19: Secondary | ICD-10-CM | POA: Insufficient documentation

## 2021-12-06 DIAGNOSIS — J029 Acute pharyngitis, unspecified: Secondary | ICD-10-CM | POA: Insufficient documentation

## 2021-12-06 LAB — RAPID FLU A & B,  RSV, SARS-COV-2 PCR
COVID-19 Coronavirus Qual PCR Result: NOT DETECTED
Rapid Influenza A PCR Result: NOT DETECTED
Rapid Influenza B PCR Result: NOT DETECTED
Rapid RSV PCR Result: NOT DETECTED

## 2021-12-06 LAB — GROUP A STREP, RAPID WITH REFLEX TO CULTURE: GpA Beta Strep Rapid Detection: NEGATIVE

## 2021-12-06 MED ORDER — AEROCHAMBER PLUS FLO-VU MISC
1.0000 | Freq: Once | Status: AC
Start: 2021-12-06 — End: 2021-12-06
  Administered 2021-12-06: 1

## 2021-12-06 NOTE — Discharge Instructions (Addendum)
Your labs were negative for COVID, RSV, Flu, and Strep pharyngitis. You received a spacer for your albuterol inhaler which you are encouraged to use with self administration, use every time with your inhaler for proper effect. Use the inhaler PRN for any shortness of breath.    Follow up with your primary care provider. Return for any new or worsening symptoms, including worsening of shortness of breath that does not resolve with your inhaler.

## 2021-12-06 NOTE — ED Triage Notes (Signed)
Pt reports fever, aches, and sore throat x 2 days. Reports partner has had similar symptoms for 5-6 days

## 2021-12-06 NOTE — ED Provider Notes (Signed)
CHIEF COMPLAINT   Chief Complaint   Patient presents with    Throat Problem            HISTORY OF PRESENT ILLNESS AND REVIEW OF SYSTEMS        Ashlee Long is a 26 y/o female with a history of asthma who presents to the ED with two days of a sore throat. She reports the sore throat is generalized and there is associated anterior and posterior neck pain, though she denies neck stiffness. The patient reports associated "body aches", sinus pressure, sneezing, and cough that is worse at night with clear phlegm. Prior to when these symptoms began she had some mild chest discomfort and shortness of breath which she relates to her asthma as she has been using her Albuterol inhaler more frequently. She has mild headaches, though these are generalized and similar to her history of migraines. The patient has mild diarrhea, though she associates this with beginning her sertraline medication recently.     The patient works in an assisted living facility, though denies any exposure there. Her partner lives with her and was sick prior to when this patient had symptoms; she believes this was her primary exposure. Her and her partner have been taking COVID tests at home, though they have resulted negative. The patient denies any fevers, chills, nausea, vomiting, abdominal pain, and back pain.    Review of systems as noted above in HPI otherwise other systems reviewed and found to be negative or noncontributory.            PAST MEDICAL AND SURGICAL HISTORY   Past Medical History:   Diagnosis Date    Allergic rhinitis due to allergen     Anxiety     Asthma     Depression     Disorder of menstrual bleeding     GERD (gastroesophageal reflux disease)     Headache     Irregular menses     Lipidemia     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HCC)     NEGATIVE PAST MEDICAL HISTORY OF     PCOS (polycystic ovarian syndrome)     Pre-diabetes     Schizophrenia (HCC)        Past Surgical History:   Procedure Laterality Date    LAPAROSCOPY  DIAGNOSTIC / BIOPSY / ASPIRATION / LYSIS  07/11/2021    drainage of R sided ovarian cyst    NO PRIOR SURGERIES            MEDICATIONS AND ALLERGIES     OUTPATIENT MEDICATIONS:   Current Outpatient Medications   Medication Instructions    Acetaminophen Extra Strength 500-1,000 mg, Oral, Every 6 hours PRN    albuterol HFA (ProAir HFA) 108 (90 Base) MCG/ACT inhaler Inhale 1 to  2 puffs by mouth every 6 hours as needed for shortness of breath/wheezing.    ascorbic acid (VITAMIN C) 500 mg, Oral, Every other day, Take with oral iron    cholecalciferol (VITAMIN D-3) 2,000 units, Oral, Daily    ferrous sulfate 324 mg, Oral, Every other day, Take with vitamin C    ibuprofen (MOTRIN) 600 mg, Oral, Every 6 hours PRN       ALLERGIES:   Abilify [aripiprazole]              SOCIAL HISTORY AND FAMILY HISTORY   Social History     Tobacco Use    Smoking status: Never   Substance Use Topics    Alcohol use: Yes  Alcohol/week: 2.0 standard drinks     Types: 2 Standard drinks or equivalent per week    Drug use: Yes     Types: Marijuana       Family History       Problem (# of Occurrences) Relation (Name,Age of Onset)    Heart Disease (1) Maternal Grandmother (Debra p)    Diabetes (2) Maternal Grandmother (Debra p), Maternal Aunt Carolina Sink)    Substance Abuse (2) Maternal Aunt (Nellie l), Maternal Uncle (William p)    Fibromyalgia (1) Mother    Bleeding Tendency (2) Mother, Other (mat GGM)                  PHYSICAL EXAM   ED VITALS:  Vitals (Arrival)      T: 36.8 C (12/06/21 0558)  BP: 136/88 (12/06/21 0558)  HR: 88 (12/06/21 0558)  RR: 18 (12/06/21 0558)  SpO2: 98 % (12/06/21 0558)     Vitals (Most recent in last 24 hrs)   T: 36.1 C (12/06/21 0738)  BP: 109/67 (12/06/21 0738)  HR: 87 (12/06/21 0738)  RR: 16 (12/06/21 0738)  SpO2: 98 % (12/06/21 0558) Room air  T range: Temp  Min: 36.1 C  Max: 36.8 C  Wt 301 lb (136.533 kg)     Ht 5\' 11"  (1.803 m)     Body mass index is 41.98 kg/m.       Physical Exam  Constitutional/General  Appearance: well appearing, well nourished, no acute distress  ENT: atraumatic, tympanic membranes clear bilaterally without any bulging, drainage, or erythema, PERRL, mild tenderness to palpation over paranasal sinuses, mild anterior and posterior cervical tenderness to palpation. Posterior oropharynx with mild erythema, though no exudate, pus, or drainage. No peritonsillar abscess.  Pulm/Resp: normal respiratory effort, speaks in full sentences without issue, no use of accessory muscles, Mild expiratory wheezing bilaterally, no crackles or rhonchi  Cardiac: extremities warm/well perfused, regular rate and rhythm, no murmurs, rubs, or gallops  GI/Abdomen: soft, nondistended, nontender to palpation  MSK: moving all four extremities  Mental Status/Psych: awake, appropriate, responsive, participatory in exam   Neuro: EOMI, no gross deficits with respect to motor/sensory/cranial nerve function        LABORATORY:   Labs Reviewed   RAPID FLU A & B,  RSV, SARS-COV-2 PCR       Result Value    Rapid Flu A,B,RSV,SARS-CoV-2 PCR Nasal swab      Rapid Influenza A PCR Result        Rapid Influenza B PCR Result        Rapid RSV PCR Result        COVID-19 Coronavirus Qual PCR Result        COVID-19 Coronavirus Qual PCR Interpretation        COVID-19 Qualitative PCR Indication Symptomatic     GROUP A STREP, RAPID WITH REFLEX TO CULTURE    GpA Beta Strep Rapid Detection Negative     R/O BETA STREP CULTURE         IMAGING:     ED Wet Read -   No orders to display       Radiology Final Result -   No image results found.              EKG DOCUMENTATION      No EKG was performed           SUICIDE RISK EVALUATION             SEPSIS  ED COURSE/MEDICAL DECISION MAKING        [name] is a 26 y/o female with a history of asthma who presents to the ED with two days of a sore throat. Physical examination notable for mild expiratory wheezing bilaterally, mild tenderness to palpation over anterior and posterior cervical  lymph nodes, posterior oropharynx wild erythema, though no exudate, drainage, abscess, or pus.     Differential diagnosis includes streptococcal pharyngitis, EBV, peritonsillar abscess, viral URI or bronchitis, and PNA. The patient does not meet strong Cent[facility] criteria for strep pharyngitis, though rule out with rapid strep will help elucidate. Less likely EBV without significant cervical lymphadenopathy or fevers. No abscess seen on throat examination making abscess less likely. She has no fevers or green sputum, and physical examination is without crackles making PNA less likely as well. COVID-19 less likely with negative tests at home. Most likely diagnosis is viral URI or bronchitis considering her cough with clear sputum, reactivity with history of asthma, expiratory wheezing on physical examination, and exposure to partner she lives with who has similar symptoms.    Plan to perform rapid strep, flu, RSV, and COVID test. Therapy includes providing spacer for personal albuterol inhaler and encouraging treatment.    ED Course as of 12/06/21 0807   Mon Dec 06, 2021   [id number] Performing strep, flu, RSV, and COVID test [JS]   0701 Plan to provide spacer for inhaler and encourage treatment [JS]   0730 Patient provided with spacer at bedside, took two puffs, will re-assess [JS]   0751 Group A rapid strep is negative, awaiting rapid flu, RSV, and COVID [JS]   0755 Vital signs remain stable, re-check on patient, still resting comfortably [JS]   0804 Flu, Strep, RSV, and COVID negative, this is most likely a viral URI versus viral bronchitis with some component of pharyngitis, she remains afebrile, hemodynamically stable, and with improvement of wheezing s/p inhaler with spacer, will plan for discharge home with return precautions [JS]      ED Course User Index  [JS] Auriah Hollings, Reubin MilanJoseph Evan, Medical Student          Medications Given in the ED:   Medications   aerochamber plus flo-vu flow signal 1 each (1 each Does not apply  Given 12/06/21 0730)              CLINICAL IMPRESSION/DISPOSITION     Clinical Impressions:   [J06.9] URI (upper respiratory infection)   [J02.9] Pharyngitis        No follow-up provider specified.    Patient was given scripts for the following medications.  New Prescriptions    No medications on file            Disposition: Discharge        CRITICAL CARE/ADDITIONAL INFORMATION REVIEWED ATTENDING ONLY                 Salome SpottedSeuferling, Orvill Coulthard Evan, Medical Student  12/06/21 [phone]    I was present with the medical student. I personally performed the physical examination and medical decision making. I have verified and edited all of the medical student's documentation for this encounter.    Critical Care - No Critical Care         Manfred Shirtsorn, Elizabeth Magassy, MD  12/08/21 [phone]

## 2021-12-08 ENCOUNTER — Telehealth (HOSPITAL_BASED_OUTPATIENT_CLINIC_OR_DEPARTMENT_OTHER): Payer: Self-pay | Admitting: Clinical

## 2021-12-08 LAB — R/O BETA STREP CULTURE

## 2021-12-08 NOTE — Telephone Encounter (Signed)
RETURN CALL: MyChart reply      SUBJECT:  General Message     MESSAGE: Patient is returning a call from SW [place] and wants to discuss about seeing a pcp for medications. She has some questions about the resources as well.  Please advise, thank you

## 2021-12-08 NOTE — Telephone Encounter (Signed)
SW attempted to call patient secondary to positive mental health screening tool prior to missed PCP appointment on 7/27.  Per ER note on 7/31 for respiratory symptoms, patient recently started Sertraline.  SW has previously discussed mental health resources with patient in May 2023.  Patient's voice mail box full so SW unable to leave vmm.  SW sent MyChart message to patient reminding her of mental health resources and encouraging her to call back as needed.    Tanda Rockers, LICSW  [facility]

## 2021-12-12 ENCOUNTER — Emergency Department
Admission: EM | Admit: 2021-12-12 | Discharge: 2021-12-12 | Disposition: A | Discharge status: Home / Self Care | Payer: No Typology Code available for payment source | Attending: Emergency Medicine | Admitting: Emergency Medicine

## 2021-12-12 ENCOUNTER — Emergency Department (EMERGENCY_DEPARTMENT_HOSPITAL): Payer: No Typology Code available for payment source

## 2021-12-12 ENCOUNTER — Other Ambulatory Visit: Payer: Self-pay

## 2021-12-12 ENCOUNTER — Other Ambulatory Visit (HOSPITAL_BASED_OUTPATIENT_CLINIC_OR_DEPARTMENT_OTHER): Payer: Self-pay

## 2021-12-12 DIAGNOSIS — R051 Acute cough: Secondary | ICD-10-CM | POA: Insufficient documentation

## 2021-12-12 DIAGNOSIS — R079 Chest pain, unspecified: Secondary | ICD-10-CM | POA: Insufficient documentation

## 2021-12-12 DIAGNOSIS — R1110 Vomiting, unspecified: Secondary | ICD-10-CM | POA: Insufficient documentation

## 2021-12-12 DIAGNOSIS — Z20822 Contact with and (suspected) exposure to covid-19: Secondary | ICD-10-CM | POA: Insufficient documentation

## 2021-12-12 DIAGNOSIS — R918 Other nonspecific abnormal finding of lung field: Secondary | ICD-10-CM

## 2021-12-12 DIAGNOSIS — R1111 Vomiting without nausea: Secondary | ICD-10-CM

## 2021-12-12 DIAGNOSIS — J4531 Mild persistent asthma with (acute) exacerbation: Secondary | ICD-10-CM | POA: Insufficient documentation

## 2021-12-12 DIAGNOSIS — J40 Bronchitis, not specified as acute or chronic: Secondary | ICD-10-CM | POA: Insufficient documentation

## 2021-12-12 DIAGNOSIS — I498 Other specified cardiac arrhythmias: Secondary | ICD-10-CM

## 2021-12-12 LAB — CBC, DIFF
% Basophils: 1 %
% Eosinophils: 7 %
% Immature Granulocytes: 0 %
% Lymphocytes: 33 %
% Monocytes: 8 %
% Neutrophils: 51 %
% Nucleated RBC: 0 %
Absolute Eosinophil Count: 0.79 10*3/uL — ABNORMAL HIGH (ref 0.00–0.50)
Absolute Lymphocyte Count: 3.74 10*3/uL (ref 1.00–4.80)
Basophils: 0.11 10*3/uL (ref 0.00–0.20)
Hematocrit: 39 % (ref 36.0–45.0)
Hemoglobin: 12.4 g/dL (ref 11.5–15.5)
Immature Granulocytes: 0 10*3/uL (ref 0.00–0.05)
MCH: 24.6 pg — ABNORMAL LOW (ref 27.3–33.6)
MCHC: 32 g/dL — ABNORMAL LOW (ref 32.2–36.5)
MCV: 77 fL — ABNORMAL LOW (ref 81–98)
Monocytes: 0.91 10*3/uL — ABNORMAL HIGH (ref 0.00–0.80)
Neutrophils: 5.77 10*3/uL (ref 1.80–7.00)
Nucleated RBC: 0 10*3/uL
Platelet Count: 328 10*3/uL (ref 150–400)
RBC: 5.05 10*6/uL — ABNORMAL HIGH (ref 3.80–5.00)
RDW-CV: 15.8 % — ABNORMAL HIGH (ref 11.0–14.5)
WBC: 11.32 10*3/uL — ABNORMAL HIGH (ref 4.3–10.0)

## 2021-12-12 LAB — 1ST EXTRA LIME GREEN TOP

## 2021-12-12 LAB — COMPREHENSIVE METABOLIC PANEL
ALT (GPT): 16 U/L (ref 7–33)
AST (GOT): 20 U/L (ref 9–38)
Albumin: 4.1 g/dL (ref 3.5–5.2)
Alkaline Phosphatase (Total): 60 U/L (ref 25–100)
Anion Gap: 9 (ref 4–12)
Bilirubin (Total): 0.2 mg/dL (ref 0.2–1.3)
Calcium: 8.8 mg/dL — ABNORMAL LOW (ref 8.9–10.2)
Carbon Dioxide, Total: 23 meq/L (ref 22–32)
Chloride: 106 meq/L (ref 98–108)
Creatinine: 0.54 mg/dL (ref 0.38–1.02)
Glucose: 91 mg/dL (ref 62–125)
Potassium: 3.9 meq/L (ref 3.6–5.2)
Protein (Total): 7.5 g/dL (ref 6.0–8.2)
Sodium: 138 meq/L (ref 135–145)
Urea Nitrogen: 11 mg/dL (ref 8–21)
eGFR by CKD-EPI 2021: >60 mL/min/{1.73_m2} (ref >59)

## 2021-12-12 LAB — RAPID FLU A & B,  RSV, SARS-COV-2 PCR
COVID-19 Coronavirus Qual PCR Result: NOT DETECTED
Rapid Influenza A PCR Result: NOT DETECTED
Rapid Influenza B PCR Result: NOT DETECTED
Rapid RSV PCR Result: NOT DETECTED

## 2021-12-12 LAB — LIPASE: Lipase: 31 U/L (ref <70)

## 2021-12-12 LAB — MAGNESIUM: Magnesium: 1.8 mg/dL (ref 1.8–2.4)

## 2021-12-12 LAB — 1ST EXTRA ORANGE TOP

## 2021-12-12 LAB — 1ST EXTRA BLUE TOP

## 2021-12-12 LAB — 1ST EXTRA RED TOP

## 2021-12-12 LAB — 1ST EXTRA LAVENDER TOP

## 2021-12-12 LAB — 1ST EXTRA GOLD TOP

## 2021-12-12 LAB — 1ST EXTRA PEARL TOP

## 2021-12-12 MED ORDER — IPRATROPIUM-ALBUTEROL 0.5-2.5 (3) MG/3ML IN SOLN
RESPIRATORY_TRACT | Status: AC
Start: 2021-12-12 — End: 2021-12-12
  Administered 2021-12-12: 3 mL via RESPIRATORY_TRACT
  Filled 2021-12-12: qty 3

## 2021-12-12 MED ORDER — SPACER/AERO-HOLDING CHAMBERS DEVI
0 refills | Status: DC
Start: 2021-12-12 — End: 2022-05-12
  Filled 2021-12-12: qty 1, 30d supply, fill #0

## 2021-12-12 MED ORDER — ONDANSETRON HCL 4 MG/2ML IJ SOLN
8.0000 mg | Freq: Once | INTRAMUSCULAR | Status: AC
Start: 2021-12-12 — End: 2021-12-12

## 2021-12-12 MED ORDER — IBUPROFEN 600 MG OR TABS
600.0000 mg | ORAL_TABLET | Freq: Once | ORAL | Status: AC
Start: 2021-12-12 — End: 2021-12-12
  Administered 2021-12-12: 600 mg via ORAL
  Filled 2021-12-12: qty 1

## 2021-12-12 MED ORDER — ALBUTEROL SULFATE HFA 108 (90 BASE) MCG/ACT IN AERS
4.0000 | INHALATION_SPRAY | Freq: Four times a day (QID) | RESPIRATORY_TRACT | 0 refills | Status: DC
Start: 2021-12-12 — End: 2022-02-17
  Filled 2021-12-12: qty 8.5, 13d supply, fill #0

## 2021-12-12 MED ORDER — METHYLPREDNISOLONE NA SUC (PF) 125 MG IJ SOLR
125.0000 mg | Freq: Once | INTRAMUSCULAR | Status: AC
Start: 2021-12-12 — End: 2021-12-12
  Administered 2021-12-12: 125 mg via INTRAVENOUS
  Filled 2021-12-12: qty 2

## 2021-12-12 MED ORDER — ONDANSETRON HCL 4 MG/2ML IJ SOLN
INTRAMUSCULAR | Status: AC
Start: 2021-12-12 — End: 2021-12-12
  Administered 2021-12-12: 8 mg via INTRAVENOUS
  Filled 2021-12-12: qty 4

## 2021-12-12 MED ORDER — PREDNISONE 50 MG OR TABS
50.0000 mg | ORAL_TABLET | Freq: Every day | ORAL | 0 refills | Status: DC
Start: 2021-12-12 — End: 2022-02-17
  Filled 2021-12-12: qty 3, 3d supply, fill #0

## 2021-12-12 MED ORDER — ACETAMINOPHEN 500 MG OR TABS
1000.0000 mg | ORAL_TABLET | Freq: Once | ORAL | Status: AC
Start: 2021-12-12 — End: 2021-12-12
  Administered 2021-12-12: 1000 mg via ORAL
  Filled 2021-12-12: qty 2

## 2021-12-12 MED ORDER — LACTATED RINGERS BOLUS
1000.0000 mL | Freq: Once | INTRAVENOUS | Status: AC
Start: 2021-12-12 — End: 2021-12-12
  Administered 2021-12-12: 1000 mL via INTRAVENOUS

## 2021-12-12 MED ORDER — IPRATROPIUM-ALBUTEROL 0.5-2.5 (3) MG/3ML IN SOLN
3.0000 mL | RESPIRATORY_TRACT | Status: DC | PRN
Start: 2021-12-12 — End: 2021-12-12
  Administered 2021-12-12: 3 mL via RESPIRATORY_TRACT
  Filled 2021-12-12: qty 3

## 2021-12-12 NOTE — ED Provider Notes (Signed)
CHIEF COMPLAINT   Chief Complaint   Patient presents with    Cough    Diarrhea            HISTORY OF PRESENT ILLNESS   The patient is a 26 year old woman with past medical history of asthma, and irregular menses status post IUD who presents emergency department with cough for 1 week diarrhea, chest pain with coughing and posttussive emesis.  Patient symptoms have been progressively getting worse over the past week.  Patient was evaluated in the emergency department and told to have a viral URI.  During this time she has had progressively worse coughing and had increase in her albuterol usage.  Albuterol usage has not been helping with her current symptoms despite the escalation in the amount of usage.  Patient presents to the emergency department today because she is having shortness of breath, vomiting and dry heaving with coughing episodes, and generalized aches and pains including chest pain.  Patient does not have chest pain on exertion but only has chest pain with coughing.  Patient denies abdominal pain, lower extremity swelling, and intractable headache.    Review of systems as noted above in HPI otherwise other systems reviewed and found to be negative or noncontributory.        PAST MEDICAL AND SURGICAL HISTORY     Past Medical History:   Diagnosis Date    Allergic rhinitis due to allergen     Anxiety     Asthma     Depression     Disorder of menstrual bleeding     GERD (gastroesophageal reflux disease)     Headache     Irregular menses     Lipidemia     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[place])     NEGATIVE PAST MEDICAL HISTORY OF     PCOS (polycystic ovarian syndrome)     Pre-diabetes     Schizophrenia ([place])        Past Surgical History:   Procedure Laterality Date    LAPAROSCOPY DIAGNOSTIC / BIOPSY / ASPIRATION / LYSIS  07/11/2021    drainage of R sided ovarian cyst    NO PRIOR SURGERIES            MEDICATIONS AND ALLERGIES     OUTPATIENT MEDICATIONS:   Current Outpatient Medications   Medication  Instructions    Acetaminophen Extra Strength 500-1,000 mg, Oral, Every 6 hours PRN    albuterol HFA 108 (90 Base) MCG/ACT inhaler Inhale 4 puffs by mouth every 6 hours for 2 days, THEN 4 puffs by mouth every 6 hours as needed thereafter.    ascorbic acid (VITAMIN C) 500 mg, Oral, Every other day, Take with oral iron    cholecalciferol (VITAMIN D-3) 2,000 units, Oral, Daily    ferrous sulfate 324 mg, Oral, Every other day, Take with vitamin C    ibuprofen (MOTRIN) 600 mg, Oral, Every 6 hours PRN    predniSONE 50 MG tablet Take 1 tablet (50 mg) by mouth daily. Take with food. Start on 12/13/21.    Spacer/Aero-Holding Chambers device Use with inhaler as directed.       ALLERGIES:   Abilify [aripiprazole]          SOCIAL HISTORY AND FAMILY HISTORY   Social History     Tobacco Use    Smoking status: Never   Substance Use Topics    Alcohol use: Yes     Alcohol/week: 2.0 standard drinks     Types: 2 Standard drinks or  equivalent per week    Drug use: Yes     Types: Marijuana       Family History       Problem (# of Occurrences) Relation (Name,Age of Onset)    Heart Disease (1) Maternal Grandmother (Debra p)    Diabetes (2) Maternal Grandmother (Debra p), Maternal Aunt Velva Harman)    Substance Abuse (2) Maternal Aunt (Nellie l), Maternal Uncle (William p)    Fibromyalgia (1) Mother    Bleeding Tendency (2) Mother, Other (mat [place])                 PHYSICAL EXAM   ED VITALS:  Vitals (Arrival)      T: (!) 35.9 C (12/12/21 0931)  BP: (!) 145/79 (12/12/21 0931)  HR: 93 (12/12/21 0931)  RR: 16 (12/12/21 0931)  SpO2: 100 % (12/12/21 0931) Room air   Vitals (Most recent in last 24 hrs)   T: (!) 35.9 C (12/12/21 0931)  BP: 129/68 (12/12/21 1131)  HR: 84 (12/12/21 1131)  RR: 18 (12/12/21 1131)  SpO2: 98 % (12/12/21 1131) Room air  T range: Temp  Min: 35.9 C  Max: 35.9 C  Wt 301 lb (136.533 kg)     Ht _0  (1.803 m)     Body mass index is 41.98 kg/m.     General: Mild acute distress, nontoxic-appearing  HEENT: NC/AT, PERRL, EOMI, MMM    Neck: Normal ROM, trachea midline  CV: RRR, no murmurs appreciated, radial pulses present and equal bilaterally   Pulm: Normal work of breathing, diminished breath sounds in bases, diffuse total phase wheezing, no stridor   Abdominal: soft, non-tender, non-distended, no rebound/guarding   Extremities: No obvious bony deformities, no LE edema   Neuro: Alert, oriented to person, place, time, and situation, CN II-XII grossly intact, Spontaneously moving all extremities without focal deficits  Psych: Appropriate mood and congruent affect  Skin: warm, well perfused       LABORATORY:   Labs Reviewed   CBC, DIFF - Abnormal       Result Value    WBC 11.32 (*)     RBC 5.05 (*)     Hemoglobin 12.4      Hematocrit 39      MCV 77 (*)     MCH 24.6 (*)     MCHC 32.0 (*)     Platelet Count 328      RDW-CV 15.8 (*)     % Neutrophils 51      % Lymphocytes 33      % Monocytes 8      % Eosinophils 7      % Basophils 1      % Immature Granulocytes 0      Neutrophils 5.77      Absolute Lymphocyte Count 3.74      Monocytes 0.91 (*)     Absolute Eosinophil Count 0.79 (*)     Basophils 0.11      Immature Granulocytes 0.00      Nucleated RBC 0.00      % Nucleated RBC 0      RBC Morphology See CBC - no additional findings      Platelet Morphology See CBC - no additional findings      WBC Morphology See CBC - no additional findings     COMPREHENSIVE METABOLIC PANEL - Abnormal    Sodium 138      Potassium 3.9      Chloride 106  Carbon Dioxide, Total 23      Anion Gap 9      Glucose 91      Urea Nitrogen 11      Creatinine 0.54      Protein (Total) 7.5      Albumin 4.1      Bilirubin (Total) 0.2      Calcium 8.8 (*)     AST (GOT) 20      Alkaline Phosphatase (Total) 60      ALT (GPT) 16      eGFR by CKD-EPI 2021 >60     RAPID FLU A & B,  RSV, SARS-COV-2 PCR    Rapid Flu A,B,RSV,SARS-CoV-2 PCR Nasopharyngeal Swab      Rapid Influenza A PCR Result None detected      Rapid Influenza B PCR Result None detected      Rapid RSV PCR Result None  detected      COVID-19 Coronavirus Qual PCR Result None detected      COVID-19 Coronavirus Qual PCR Interpretation        Value: This is a negative result. Laboratory testing alone cannot rule out infection, particularly in the presence of clinical risk factors such as symptoms or exposure history.    COVID-19 Qualitative PCR Indication Symptomatic     MAGNESIUM    Magnesium 1.8     LIPASE    Lipase 31           IMAGING:     ED Wet Read -   XR Chest 1 View   Final Result      Lines and tubes: None.      Lungs: Bibasilar streaky opacities, likely atelectasis.      Pleura: No effusion. No pneumothorax.      Heart and mediastinum: Unremarkable.      Bones: No acute or suspicious abnormality.      I have personally reviewed the images and agree with the report (or as edited).          Radiology Final Result -   No image results found.              EKG DOCUMENTATION               SUICIDE RISK EVALUATION             SEPSIS               4TH  YEAR RESIDENT            ED COURSE/MEDICAL DECISION MAKING   On my review of previous records office visit on 10/07/2021 with obstetrics and gynecology for patient was seen for abnormal uterine bleeding and discharge.  IUD was placed which helped with abnormal uterine bleeding and OB/GYN did not see discharge on exam with physiologic discharge.  Patient also has documented history of PCOS.    The patient is a 26 year old female with past medical history of asthma who presents with cough, vomiting, diarrhea, and shortness of breath. Vitals on presentation were significant for being within normal limits aside from mild decrease in temperature at 35.9 C, and mild hypertension 145/79.  Patient is not tachycardic, not hypoxemic. My differential diagnosis includes but is not limited to ACS, pneumonia, pleural effusion, pulmonary edema, PE, viral URI, gastritis, gastroenteritis, peptic ulcer disease, duodenal ulcer, electrolyte abnormality, anemia, asthma exacerbation,  bronchitis.    Therapies at this time include Zofran, Tylenol, ibuprofen, DuoNeb, Solu-Medrol.    ED Course as of  12/12/21 1603   Sun Dec 12, 2021   1050 Laboratory evaluation by me with reassuring magnesium, reassuring lipase, reassuring LFTs in setting of abdominal pain and vomiting.  Remainder of electrolyte panel reassuring without significant derangements, creatinine reassuring against AKI.  Instead of vomiting lower concern for severe dehydration and electrolyte abnormality.  White blood cell count mildly elevated overall reassuring against significant process, hemoglobin hematocrit within normal limits, MCV low with potential for microcytic anemia.  Viral swab also negative for flu, RSV, and COVID-19.  Chest x-ray with streaky opacification consistent with URI versus bronchitis without focal opacification.  Reassuring against focal pneumonia. [LC]   1057 EKG interpreted by me with ventricular rate of 82, PR 134, QRS of 84, QTc of 450, normal axis, sinus rhythm, nonspecific T wave abnormalities, no ST segment elevation or depression.  Overall reassuring against ACS.  In my interpretation sinus rhythm without evidence of ischemia. [LC]   1610 On reevaluation patient has improvement in clinical symptoms and is feeling better after fluids, Zofran, nebs, steroids, and albuterol.  We will give patient outpatient medications and have her follow-up with primary care, or the emergency department if worsens. [LC]      ED Course User Index  [LC] Eather Colas, MD       See ED course above for my interpretations of studies ordered during this visit. In brief, patient had reassuring laboratory evaluation, imaging studies, and clinical improvement while in the emergency department.  The above differential diagnoses are less likely after reassuring ED visit.  In my clinical judgment patient most likely has asthma exacerbation in setting of bronchitis from viral URI.  Discussed findings with patient who understands and  agrees to the current treatment plan.  Given significant improvement in symptoms patient discharged to home/self-care.  Advised patient follow-up with her primary care doctor in 1 week to talk about her asthma symptoms.  Gave patient a referral to primary care medicine for further medical management.       The following medications were administered in the ED:  Medications   lactated ringers IV Bolus 1,000 mL (1,000 mL Intravenous New Bag/Syringe 12/12/21 1005)   ibuprofen (Motrin) tablet 600 mg (600 mg Oral Given 12/12/21 1025)   acetaminophen (Tylenol) tablet 1,000 mg (1,000 mg Oral Given 12/12/21 1025)   methylPREDNISolone sodium succinate PF (SOLU-Medrol) injection 125 mg (125 mg Intravenous Given 12/12/21 1020)   ondansetron (Zofran) injection 8 mg (8 mg Intravenous Given 12/12/21 1019)       Patient was given scripts for the following medications:  Discharge Medication List as of 12/12/2021 11:19 AM        START taking these medications    Details   predniSONE 50 MG tablet Take 1 tablet (50 mg) by mouth daily. Take with food. Start on 12/13/21.Disp-3 tablet, R-0, Normal      Spacer/Aero-Holding Chambers device Use with inhaler as directed.Disp-1 each, R-0, Normal         Patient was given a refill of her albuterol inhaler    Follow Up:  [place], [phone], [place] [address]  [place] [phone]  [phone]            In my clinical judgment, the patient is safe to complete further workup and management on an outpatient basis.         CLINICAL IMPRESSION/DISPOSITION   Clinical Impressions:   [J40] Bronchitis   [J45.31] Mild persistent asthma with acute exacerbation   [R11.11]  Vomiting without nausea, unspecified vomiting type   [R05.1] Acute cough   [R07.9] Chest pain, unspecified type         Disposition: Discharge       [place] INFORMATION REVIEWED  - ATTENDING Daniel Nones, MD  Resident  12/12/21 [phone]

## 2021-12-12 NOTE — ED Notes (Signed)
Patient given and verbalized understanding of discharge instructions, prescriptions, and follow up information. Patient denies any questions when asked. Patient discharged to home with follow up and to return for new or worsening symptoms. No changes with patient since provider last seen patient. Patient discharged to home with discharge paperwork.    Patient remains alert and oriented, breathing non-labored and even. Skin warm dry. Vitals remain stable with no new changes. No acute distress noted. Patient calm, cooperative. Motor/sensory intact. All of patients belongings and valuables are in patients possession.       Wilber Bihari, RN  12/12/21 1135

## 2021-12-12 NOTE — ED Triage Notes (Signed)
Pt c/o cough x 1 week, diarrhea, and CP when coughing. Sts vomits after coughing fits. Denies fever, chills. Hx of asthma, sts has been using inhaler but provides minimal relief. VSS, A&O x4.

## 2021-12-12 NOTE — Discharge Instructions (Addendum)
You were seen in the emergency room for your coughing, and worsening symptoms.  We still believe that you have a virus that is causing this.  Given your history of asthma we have discharged you with medications to help improve your asthma symptoms.  Please follow-up with a primary care doctor if symptoms continue, or return to the emergency department if you have worsening or unable to see your primary care doctor.    Please take Tylenol and ibuprofen for pain.

## 2021-12-13 ENCOUNTER — Telehealth (HOSPITAL_BASED_OUTPATIENT_CLINIC_OR_DEPARTMENT_OTHER): Payer: Self-pay | Admitting: Clinical

## 2021-12-13 NOTE — Telephone Encounter (Signed)
SW called patient per her request to f/u mental health resources.  Patient states she tried calling [facility] back in May but they said they didn't take her insurance [facility] [employer]) and she didn't have a chance to contact [employer].  Patient wants to re-engage in therapy and restart psychiatric medications.      SW encouraged patient to call [facility] and/or [employer] back as her [employer] should be accepted by either clinic.  SW also asked patient about missed intake with Dr. Charna Busman.  Patient states she needs to call to reschedule this and is still planning to.     Patient reports no additional sw needs at this time and reports having the information she needs.    Tanda Rockers, LICSW  [facility]

## 2021-12-14 LAB — EKG 12 LEAD
Atrial Rate: 82 {beats}/min
P Axis: 25 degrees
P-R Interval: 134 ms
Q-T Interval: 386 ms
QRS Duration: 84 ms
QTC Calculation: 450 ms
R Axis: 15 degrees
T Axis: 31 degrees
Ventricular Rate: 82 {beats}/min

## 2021-12-17 ENCOUNTER — Telehealth (HOSPITAL_BASED_OUTPATIENT_CLINIC_OR_DEPARTMENT_OTHER): Payer: Self-pay | Admitting: Obstetrics & Gynecology

## 2021-12-17 NOTE — Telephone Encounter (Signed)
RETURN CALL: Voicemail - Detailed Message      SUBJECT:  Appointment Request     REASON FOR VISIT: STD   PREFERRED DATE/TIME: Monday - Friday after 3:30 pm  REASON UNABLE TO APPOINT: patient would like an appointment week of 8/13 with any provider, ccr unable to appoint, offered wait list with next available. Requesting to speak to care team.  Please assist, per SM.

## 2021-12-20 NOTE — Telephone Encounter (Signed)
VM full - unable to reach - ok to schedule w. PCP or any GYN

## 2021-12-21 ENCOUNTER — Other Ambulatory Visit (HOSPITAL_BASED_OUTPATIENT_CLINIC_OR_DEPARTMENT_OTHER): Payer: Self-pay

## 2021-12-21 ENCOUNTER — Other Ambulatory Visit (HOSPITAL_COMMUNITY): Payer: Self-pay | Admitting: Unknown Physician Specialty

## 2021-12-29 ENCOUNTER — Ambulatory Visit
Payer: No Typology Code available for payment source | Attending: Obstetrics & Gynecology | Admitting: Student in an Organized Health Care Education/Training Program

## 2021-12-29 ENCOUNTER — Encounter (HOSPITAL_BASED_OUTPATIENT_CLINIC_OR_DEPARTMENT_OTHER): Payer: Self-pay | Admitting: Student in an Organized Health Care Education/Training Program

## 2021-12-29 VITALS — BP 122/80 | HR 90 | Temp 97.3°F

## 2021-12-29 DIAGNOSIS — L732 Hidradenitis suppurativa: Secondary | ICD-10-CM | POA: Insufficient documentation

## 2021-12-29 DIAGNOSIS — Z113 Encounter for screening for infections with a predominantly sexual mode of transmission: Secondary | ICD-10-CM | POA: Insufficient documentation

## 2021-12-29 NOTE — Progress Notes (Signed)
[facility] CLINIC  RETURN GYNECOLOGY VISIT    Patient Referred By: No ref. provider found  Patient's PCP: Pcp, None    Patient's preferred name: Ashlee Long  Patient's pronouns: she/her       Chief Complaint   Patient presents with    Gyn Concern     STI screening, IUD check, spotting, odor        Subjective:     Ashlee Long is a 26 year old female who presents on 12/29/2021 for STD follow-up.    Diagnosed with chlamydia 08/19/21 and completed doxycycline treatment. Had negative testing 6/1/823    Worried about exposure given recent infidelity. Would like vaginal testing today. Having some complicated social situations.    Interested in getting PCP.    OB HISTORY   G2P0020  OB History       Gravida   2    Para   0    Term   0    Preterm   0    AB   2    Living   0         SAB        IAB        Ectopic        Multiple        Live Births               # Date GA Outcome Labor Sex Weight Anes    1   Abortion            2   Abortion                  GYN HISTORY   Menses: irregular; known PCOS  Sexually Active: with female partners  Contraception: mirena IUD   Fertility planning: desires pregnancy  STIs: h/o GC, CT, and trich  Cervical Cancer Screening: recent ASCUS/hrHPV+ Pap per HPI; unsure of prior results  Gardasil: received x 3 in middle school       Objective:    Vitals: BP 122/80   Pulse 90   Temp 36.3 C (Temporal)   LMP 09/21/2021 (Approximate)   SpO2 98%   General: healthy, alert, no distress.  Respiratory: Normal respiratory effort and chest wall movement with respiration.   Psychiatric:  Affect: Appropriate  Neurologic:  Gait:  Normal.  Pelvic Exam: External genitalia significant for one inflamed follicle and skin changes consistent with hidradenitis suppurative. Vagina is rugated and well-estrogenized, cervix normal in appearance with IUD strings visualized, no CMT, no bladder tenderness, difficult to appreciate uterus and ovaries given habitus but no abnormalities appreciated.    Exam  chaperoned by medical assistant       Assessment and Plan:      [name] is a 26 year old G2P0020 who presents for STI check    Routine screening for STI (sexually transmitted infection)  Vaginal testing done today. Will follow results by MyChart.  -     GC and Chlam Nucleic Acid Detection; Future  -     Trichomonas Nucleic Acid Detection; Future    Hidradenitis suppurativa  -     Referral to Dermatology; Future    # HCM:  - Due for repeat pap 07/2022  - Referral sent for PCP at [facility] internal med    Follow Up Plan: PRN    Patient was discussed with Dr. Idolina Primer, attending physician.     Jeannette Corpus, MD   OB/GYN Resident Physician

## 2021-12-29 NOTE — Patient Instructions (Addendum)
Instructions for taking photos and uploading into MyChart:    Ensure the flash is on  If the concern is related to single spot, use a pen to make an arrow pointing to the spot so we send back information about the correct area  Take 1 image that is far enough back that we can tell where it is on the body  Take a close up image (No closer than 12 inches)  OK to take more images as needed  Check for sharp focus before sending    Uploading using your computer:  Use your browser to log into:  MyChart   Click on the icon at the top of the screen for "Messaging" and select "ask a question" and then "ask your clinic a question"  Select me from the dropdown menu and enter in your question in the empty white box.  Please keep in mind that, if you keep your message brief, we can help more people each day.    Click "attach an image or video" and connect to the picture file you want to upload.  Unfortunately, we cannot provide computer support to help you find these picture files on your computer hard drive.      Uploading using your phone (MyChart) app:  Open the MyChart app on your phone and log in  Click on the icon for "Messaging" and select "send a message" for "medical advice"  Select me in the "To: " field and enter in your question in the empty white box.  Please keep in mind that, if you keep your message brief, we can help more people each day.    Click on the "Attachments: " field and click "attach image or video" and navigate to the file on your phone.  Unfortunately, we cannot provide tech support to help you find these picture files on your phone.

## 2021-12-30 LAB — GC&CHLAM NUCLEIC ACID DETECTN
Chlam Trachomatis Nucleic Acid: NEGATIVE
N.Gonorrhoeae(GC) Nucleic Acid: NEGATIVE

## 2021-12-30 NOTE — Progress Notes (Signed)
I did not personally see the patient, but have reviewed the history, physical, and care plan with the Resident. I have reviewed the documentation and agree with it.     Beautiful Pensyl, MD

## 2021-12-30 NOTE — Telephone Encounter (Signed)
Pt sending updated records  Routed to Dr Katrinka Blazing to review

## 2021-12-31 LAB — TRICHOMONAS NUCLEIC ACID: Trichomonas Nucleic Acid Res.: NEGATIVE

## 2022-01-13 ENCOUNTER — Encounter (HOSPITAL_BASED_OUTPATIENT_CLINIC_OR_DEPARTMENT_OTHER): Payer: Self-pay | Admitting: Student in an Organized Health Care Education/Training Program

## 2022-01-13 NOTE — Telephone Encounter (Signed)
Pt scheduled for return visit for r/o BV and full STI panel.  Pt. verbalized understanding of plan.  No further questions at this time.

## 2022-01-18 ENCOUNTER — Encounter (INDEPENDENT_AMBULATORY_CARE_PROVIDER_SITE_OTHER): Payer: Self-pay

## 2022-01-18 NOTE — Progress Notes (Signed)
[place] VISIT    Patient Referred By: No ref. provider found  Patient's PCP: Carol Ada, ND    Patient's preferred name: Ashlee Long  Patient's pronouns: she/her       Chief Complaint   Patient presents with    Follow-Up      BV and STI        Subjective:     Ashlee Long is a 26 year old female who presents on 01/19/2022 for STI check and concerns for BV.    Diagnosed with chlamydia 08/19/21 and completed doxycycline treatment. Had negative testing 10/07/21. Seen by myself 8/23 with negative GC/CT/Trich.    Would like to complete STI screening with blood tests. Also feels her vaginal discharge is off. No itching, no particular odor although does smell different, no change in color. Had BV in the past    Hasn't yet established with PCP, needs refill on albuterol inhaler    OB HISTORY   G2P0020  OB History       Gravida   2    Para   0    Term   0    Preterm   0    AB   2    Living   0         SAB        IAB        Ectopic        Multiple        Live Births               # Date GA Outcome Labor Sex Weight Anes    1   Abortion            2   Abortion                  GYN HISTORY   Menses: irregular; known PCOS  Sexually Active: not currently  Contraception: mirena IUD   Fertility planning: not discussed today  STIs: h/o GC, CT, and trich  Cervical Cancer Screening: 07/2021 ASCUS/hrHPV+ with negative ECC; unsure of prior results  Gardasil: received x 3 in middle school       Objective:    Vitals: BP (!) 69/55 Comment: 2nd BP: 110/76  Pulse 89   Temp 36.4 C (Temporal)   Resp 18   Ht 5' 11"  (1.803 m)   Wt (!) 138.8 kg (306 lb)   SpO2 98%   BMI 42.68 kg/m   General: healthy, alert, no distress.  Respiratory: Normal respiratory effort and chest wall movement with respiration.   Psychiatric:  Affect: Appropriate  Neurologic:  Gait:  Normal.  Pelvic Exam: External genitalia significant with skin changes consistent with hidradenitis suppurative. Vagina is rugated and  well-estrogenized, cervix normal in appearance with IUD strings visualized. Wet prep collected.    Exam chaperoned by medical assistant    Office Visit on 01/19/22   1. POC PPMP Wet Prep/KOH   Result Value Ref Range    Odor Positive odor with KOH     WBC 0     RBC, Wet Prep 0     Bacteria 1+     Clue Cells Present     Yeast, Wet Prep None     Epithelial Cells, Wet Prep 1+     Trich None     Other, Wet Prep pH 4.5         Assessment and Plan:  Ashlee Long is a 26 year old G2P0020 who presents for STI check and BV evaluation.    [name] was seen today for follow-up .    Vaginal discharge  Bacterial vaginosis  No concerning discharge on exam. On wet prep, met 2 of 3 Amsel's criteria, consistent with BV. Will send Rx for metrogel. Discussed with patient.   -     POC PPMP Wet Prep/KOH  -     metroNIDAZOLE 0.75 % vaginal gel; Place 1 applicatorful into the vagina at bedtime for 5 days.  Dispense: 70 g; Refill: 0    Routine screening for STI (sexually transmitted infection)  Recent negative vaginal testing.  -     HIV Antigen and Antibody Screen; Future  -     Syphilis Screen; Future    Uncomplicated asthma, unspecified asthma severity, unspecified whether persistent  Not yet established with PCP. Will provide one time refill of albuterol.  -     albuterol HFA 108 (90 Base) MCG/ACT inhaler; Inhale 2 puffs by mouth as needed for shortness of breath/wheezing.  Dispense: 8 g; Refill: 0      # HCM:  - Due for repeat pap 07/2022  - Referral previously sent for PCP at [facility] internal med--will reach out to schedulers to help    Follow Up Plan: PRN    Patient was seen with Dr. Willette Pa, attending physician.     Linford Arnold, MD   OB/GYN Resident Physician

## 2022-01-19 ENCOUNTER — Other Ambulatory Visit (HOSPITAL_BASED_OUTPATIENT_CLINIC_OR_DEPARTMENT_OTHER): Payer: Self-pay

## 2022-01-19 ENCOUNTER — Other Ambulatory Visit (HOSPITAL_BASED_OUTPATIENT_CLINIC_OR_DEPARTMENT_OTHER): Payer: Self-pay | Admitting: Student in an Organized Health Care Education/Training Program

## 2022-01-19 ENCOUNTER — Encounter (HOSPITAL_BASED_OUTPATIENT_CLINIC_OR_DEPARTMENT_OTHER): Payer: Self-pay | Admitting: Student in an Organized Health Care Education/Training Program

## 2022-01-19 ENCOUNTER — Ambulatory Visit
Payer: No Typology Code available for payment source | Attending: Obstetrics & Gynecology | Admitting: Student in an Organized Health Care Education/Training Program

## 2022-01-19 ENCOUNTER — Other Ambulatory Visit (HOSPITAL_COMMUNITY): Payer: Self-pay | Admitting: Obstetrics & Gynecology

## 2022-01-19 VITALS — BP 69/55 | HR 89 | Temp 97.6°F | Resp 18 | Ht 71.0 in | Wt 306.0 lb

## 2022-01-19 DIAGNOSIS — Z113 Encounter for screening for infections with a predominantly sexual mode of transmission: Secondary | ICD-10-CM | POA: Insufficient documentation

## 2022-01-19 DIAGNOSIS — N921 Excessive and frequent menstruation with irregular cycle: Secondary | ICD-10-CM

## 2022-01-19 DIAGNOSIS — N898 Other specified noninflammatory disorders of vagina: Secondary | ICD-10-CM | POA: Insufficient documentation

## 2022-01-19 DIAGNOSIS — B9689 Other specified bacterial agents as the cause of diseases classified elsewhere: Secondary | ICD-10-CM | POA: Insufficient documentation

## 2022-01-19 DIAGNOSIS — N760 Acute vaginitis: Secondary | ICD-10-CM | POA: Insufficient documentation

## 2022-01-19 DIAGNOSIS — J45909 Unspecified asthma, uncomplicated: Secondary | ICD-10-CM | POA: Insufficient documentation

## 2022-01-19 DIAGNOSIS — E559 Vitamin D deficiency, unspecified: Secondary | ICD-10-CM | POA: Insufficient documentation

## 2022-01-19 LAB — WET PREP/KOH/TEST, ONSITE
Odor: POSITIVE
RBC, Wet Prep: 0
WBC: 0

## 2022-01-19 LAB — CBC (HEMOGRAM)
Hematocrit: 37 % (ref 36.0–45.0)
Hemoglobin: 11.8 g/dL (ref 11.5–15.5)
MCH: 24.8 pg — ABNORMAL LOW (ref 27.3–33.6)
MCHC: 31.7 g/dL — ABNORMAL LOW (ref 32.2–36.5)
MCV: 78 fL — ABNORMAL LOW (ref 81–98)
Platelet Count: 312 10*3/uL (ref 150–400)
RBC: 4.76 10*6/uL (ref 3.80–5.00)
RDW-CV: 14.9 % — ABNORMAL HIGH (ref 11.0–14.5)
WBC: 11.24 10*3/uL — ABNORMAL HIGH (ref 4.3–10.0)

## 2022-01-19 MED ORDER — METRONIDAZOLE 0.75 % VA GEL
1.0000 | Freq: Every evening | VAGINAL | 0 refills | Status: AC
Start: 2022-01-19 — End: 2022-01-24
  Filled 2022-01-19: qty 70, 5d supply, fill #0

## 2022-01-19 MED ORDER — ALBUTEROL SULFATE HFA 108 (90 BASE) MCG/ACT IN AERS
2.0000 | INHALATION_SPRAY | RESPIRATORY_TRACT | 0 refills | Status: DC | PRN
Start: 2022-01-19 — End: 2022-01-19
  Filled 2022-01-19: qty 8.5, fill #0

## 2022-01-20 ENCOUNTER — Other Ambulatory Visit (HOSPITAL_BASED_OUTPATIENT_CLINIC_OR_DEPARTMENT_OTHER): Payer: Self-pay

## 2022-01-20 LAB — SYPHILIS SCREEN: T. pallidum IgG/IgM Ab (Bioplex): NONREACTIVE

## 2022-01-20 LAB — HIV ANTIGEN AND ANTIBODY SCRN
HIV Antigen and Antibody Interpretation: NONREACTIVE
HIV Antigen and Antibody Result: NONREACTIVE

## 2022-01-20 LAB — FERRITIN: Ferritin: 13 ng/mL (ref 10–180)

## 2022-01-21 ENCOUNTER — Other Ambulatory Visit (HOSPITAL_BASED_OUTPATIENT_CLINIC_OR_DEPARTMENT_OTHER): Payer: Self-pay

## 2022-01-21 LAB — VITAMIN D (25 HYDROXY)
Vit D (25_Hydroxy) Total: 19.8 ng/mL — ABNORMAL LOW (ref 20.1–50.0)
Vitamin D2 (25_Hydroxy): <1 ng/mL
Vitamin D3 (25_Hydroxy): 19.8 ng/mL

## 2022-01-21 MED ORDER — ALBUTEROL SULFATE HFA 108 (90 BASE) MCG/ACT IN AERS
2.0000 | INHALATION_SPRAY | RESPIRATORY_TRACT | 0 refills | Status: DC | PRN
Start: 2022-01-21 — End: 2022-02-17
  Filled 2022-01-21: qty 8.5, 17d supply, fill #0

## 2022-01-21 NOTE — Result Encounter Note (Signed)
See MyChart message

## 2022-01-21 NOTE — Telephone Encounter (Signed)
Resent RX

## 2022-01-25 ENCOUNTER — Encounter (INDEPENDENT_AMBULATORY_CARE_PROVIDER_SITE_OTHER): Payer: Self-pay | Admitting: Family Medicine

## 2022-01-26 ENCOUNTER — Other Ambulatory Visit (HOSPITAL_BASED_OUTPATIENT_CLINIC_OR_DEPARTMENT_OTHER): Payer: Self-pay | Admitting: Obstetrics & Gynecology

## 2022-02-04 ENCOUNTER — Other Ambulatory Visit (HOSPITAL_BASED_OUTPATIENT_CLINIC_OR_DEPARTMENT_OTHER): Payer: Self-pay

## 2022-02-08 ENCOUNTER — Other Ambulatory Visit (HOSPITAL_BASED_OUTPATIENT_CLINIC_OR_DEPARTMENT_OTHER): Payer: Self-pay

## 2022-02-10 ENCOUNTER — Encounter (INDEPENDENT_AMBULATORY_CARE_PROVIDER_SITE_OTHER): Payer: Self-pay

## 2022-02-17 ENCOUNTER — Ambulatory Visit
Payer: No Typology Code available for payment source | Attending: Internal Medicine | Admitting: Student in an Organized Health Care Education/Training Program

## 2022-02-17 VITALS — BP 123/83 | HR 95 | Temp 97.7°F | Ht 70.0 in | Wt 304.8 lb

## 2022-02-17 DIAGNOSIS — F411 Generalized anxiety disorder: Secondary | ICD-10-CM | POA: Insufficient documentation

## 2022-02-17 DIAGNOSIS — J45909 Unspecified asthma, uncomplicated: Secondary | ICD-10-CM | POA: Insufficient documentation

## 2022-02-17 DIAGNOSIS — Z7689 Persons encountering health services in other specified circumstances: Secondary | ICD-10-CM | POA: Insufficient documentation

## 2022-02-17 DIAGNOSIS — K219 Gastro-esophageal reflux disease without esophagitis: Secondary | ICD-10-CM | POA: Insufficient documentation

## 2022-02-17 DIAGNOSIS — Z113 Encounter for screening for infections with a predominantly sexual mode of transmission: Secondary | ICD-10-CM | POA: Insufficient documentation

## 2022-02-17 DIAGNOSIS — R1011 Right upper quadrant pain: Secondary | ICD-10-CM | POA: Insufficient documentation

## 2022-02-17 DIAGNOSIS — D72821 Monocytosis (symptomatic): Secondary | ICD-10-CM | POA: Insufficient documentation

## 2022-02-17 DIAGNOSIS — F209 Schizophrenia, unspecified: Secondary | ICD-10-CM | POA: Insufficient documentation

## 2022-02-17 DIAGNOSIS — F3181 Bipolar II disorder: Secondary | ICD-10-CM | POA: Insufficient documentation

## 2022-02-17 MED ORDER — OMEPRAZOLE 20 MG OR CPDR
20.0000 mg | DELAYED_RELEASE_CAPSULE | Freq: Every day | ORAL | 0 refills | Status: DC
Start: 2022-02-17 — End: 2022-03-16
  Filled 2022-02-17: qty 30, 30d supply, fill #0
  Filled 2022-03-14: qty 12, 12d supply, fill #1

## 2022-02-17 MED ORDER — ALBUTEROL SULFATE HFA 108 (90 BASE) MCG/ACT IN AERS
2.0000 | INHALATION_SPRAY | RESPIRATORY_TRACT | 0 refills | Status: DC | PRN
Start: 2022-02-17 — End: 2022-05-18
  Filled 2022-02-17: qty 8.5, 17d supply, fill #0

## 2022-02-17 NOTE — Progress Notes (Addendum)
[facility] - Internal Medicine Clinic Note      ID/CC:    Ashlee Long is a 26 year old female who presents today to establish care and for opinion and advice regarding:    First name pronounced "All- dree- Annuh"    Lactose intolerant. 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 Here with partner Ashlee Long.     PMH:  Obtained from pt report:  Borderline personality disorder  Schizophrenia  Bipolar type II  Depression  ADHD  PTSD  Anxiety  Asthma- using inhaler sporadically now, but had flare  Vitamin D def- taking occ  Iron def- taking supplement occasionally    Right now does not have psychiatry provider  Can take a pill (quetiapine) and fall asleep to get through hard moments), sertraline and buspirone been on in the past but not now  Occ takes clonazepam; not taken recently  Had a therapist in [place], working on it  Using marijuana; feels like this has been most helpful  Does not feel addicted, has stopped before and does not do before work    PSH:  R ovary cyst procedure (drainage)    #Abdominal pain  Happening for about a year and a half  Over time is become more sharp  R sided- points mid abdomen  Lasts seconds and catches  Longest it happens for about a minute  Has tried ibuprofen 600 mg occasionally and that helps  Can feel nauseated when eats food  Certain foods like red meat can bother her  Does happen after eating; happens more with spicy foods  Also has bad acid reflux. Spicy foods or tomatoes. Since 2014.   Mom had her gallbladder removed    #Difficulty losing weight  Has appt with provider next week per chart review.   Also feels like something is underlying this. Wondering if abdominal pain related.  Depending on goals and that visit can discuss further       Past Surgical History:   Procedure Laterality Date    LAPAROSCOPY DIAGNOSTIC / BIOPSY / ASPIRATION / LYSIS  07/11/2021    drainage of R sided ovarian cyst    NO PRIOR SURGERIES           (established PFSH:  Detailed : 1 item, Complete  >2)    Family History       Problem (# of Occurrences) Relation (Name,Age of Onset)    Heart Disease (1) Maternal Grandmother (Debra p)    Diabetes (2) Maternal Grandmother (Debra p): mothers side, Maternal Aunt Carolina Sink)    Substance Abuse (2) Maternal Aunt (Nellie l), Maternal Uncle (William p)    Fibromyalgia (1) Mother    Bleeding Tendency (2) Mother, Other (mat GGM)            Social Hx:    Social History     Socioeconomic History    Marital status: Legally Separated   Tobacco Use    Smoking status: Never   Substance and Sexual Activity    Alcohol use: Yes     Alcohol/week: 2.0 standard drinks     Types: 2 Standard drinks or equivalent per week     Comment: Occasional Teqqu    Drug use: Yes     Types: Marijuana, Inhaled     Comment: current smoker 2x a day    Sexual activity: Not Currently     Partners: Female     Birth control/protection: LNG IUD         Physical  Exam   BP 123/83   Pulse 95   Temp 36.5 C (Temporal)   Ht 5\' 10"  (1.778 m)   Wt (!) 138.3 kg (304 lb 12.8 oz)   SpO2 98%   BMI 43.73 kg/m   GEN: Alert and awake, NAD  HEENT: NC/AT, moist mucus membranes, no scleral icterus  CV: RRR, no murmurs, rubs or gallops  PULM: CTAB, no wheezing  ABD: Soft, BS present but distant. Tenderness to R mid abdomen and upper quadrant. Does worsen with deep breath. No rebound or guarding. Unable to appreciate hepatosplenomegaly.   BACK: Normal ROM.   MSK: Moves all extremities spontaneously  SKIN: No rashes. Appropriate temp and color.   NEURO: Alert and interactive. No facial droop.   PSYCH: Appropriate interactions. Pleasant.           Ashlee Long is a 26 year old female presenting for:     1. Encounter to establish care      2. Schizophrenia, unspecified type (HCC)  3. Bipolar 2 disorder (HCC)  4. Generalized anxiety disorder  Pt reports prior diagnoses. Does not currently have psychiatry provider in state. Given complexity will first refer to [facility] psych provider for diagnostic clarity and medication  management if deems appropriate- does like staying at [facility] and female providers. Clarified with patient that benzodiazipines are note advisable for long term treatment and pt amenable to this (has not taken regularly) and at present using PRN quetiapine. Also working on therapy.     5. Routine screening for STI (sexually transmitted infection)  Referral tied to dx.   - Referral to Internal Medicine    6. Uncomplicated asthma, unspecified asthma severity, unspecified whether persistent  Currently under control. Had flare summer and reviewed that if another flare or worsening sx would discuss different maintenance/PRN therapy (ICS-LABA).   - albuterol HFA 108 (90 Base) MCG/ACT inhaler; Inhale 2 puffs by mouth every 4 hours as needed for shortness of breath/wheezing.  Dispense: 8.5 g; Refill: 0    7. Monocytosis  CBC with persistent leukocytosis though past checks I/s/o surgery? Vs. Other acute sx. Would like to repeat and if persist consider heme econsult referral given monocyte predominant. Will also eval spleen on abd [facility] since obtaining.   - Pathologist Review; Future  - CBC with Diff; Future  - Blood Smear Prep and Hold; Future  - Referral to Psychiatry; Future  - US Abdomen Complete; Future    8. Right upper quadrant abdominal pain  Short episodes of pain, however Fhx of gallbladder disease and risk factors along with TTP of RUQ and exacerbated with fatty food c/f gallstones. Will obtain U/S and repeat CMP. Reviewed return precautions. Otherwise consider dietary/GI causes, muscle strain, GERD referred pain, hernia, etc.   - CBC with Diff; Future  - Comprehensive Metabolic Panel; Future  - US Abdomen Complete; Future    9. Gastroesophageal reflux disease, unspecified whether esophagitis present  Reports severe GERD for years. Given extent want to see improvement with tx and f/u in about 4 weeks. If persists will likely refer to GI given length of symptoms described.   - omeprazole 20 MG DR capsule; Take 1 capsule  (20 mg) by mouth daily on an empty stomach. Take for 6 weeks. Follow up at 4 weeks or sooner to see if need to adjust dose or continue as can have rebound symptoms  Dispense: 42 capsule; Refill: 0      I saw and discussed the patient with attending physician, Dr. [place]  Brennan Bailey, MD  Internal Medicine, [facility]

## 2022-02-18 ENCOUNTER — Ambulatory Visit: Payer: No Typology Code available for payment source | Attending: Internal Medicine

## 2022-02-18 ENCOUNTER — Other Ambulatory Visit (HOSPITAL_BASED_OUTPATIENT_CLINIC_OR_DEPARTMENT_OTHER): Payer: Self-pay

## 2022-02-18 ENCOUNTER — Other Ambulatory Visit (HOSPITAL_BASED_OUTPATIENT_CLINIC_OR_DEPARTMENT_OTHER): Payer: Self-pay | Admitting: Student in an Organized Health Care Education/Training Program

## 2022-02-18 DIAGNOSIS — R1011 Right upper quadrant pain: Secondary | ICD-10-CM | POA: Insufficient documentation

## 2022-02-18 DIAGNOSIS — D72821 Monocytosis (symptomatic): Secondary | ICD-10-CM

## 2022-02-18 LAB — COMPREHENSIVE METABOLIC PANEL
ALT (GPT): 14 U/L (ref 7–33)
AST (GOT): 16 U/L (ref 9–38)
Albumin: 4.1 g/dL (ref 3.5–5.2)
Alkaline Phosphatase (Total): 57 U/L (ref 25–100)
Anion Gap: 10 (ref 4–12)
Bilirubin (Total): 0.2 mg/dL (ref 0.2–1.3)
Calcium: 9.2 mg/dL (ref 8.9–10.2)
Carbon Dioxide, Total: 25 meq/L (ref 22–32)
Chloride: 105 meq/L (ref 98–108)
Creatinine: 0.75 mg/dL (ref 0.38–1.02)
Glucose: 86 mg/dL (ref 62–125)
Potassium: 3.9 meq/L (ref 3.6–5.2)
Protein (Total): 7.5 g/dL (ref 6.0–8.2)
Sodium: 140 meq/L (ref 135–145)
Urea Nitrogen: 16 mg/dL (ref 8–21)
eGFR by CKD-EPI 2021: >60 mL/min/{1.73_m2} (ref >59)

## 2022-02-18 LAB — CBC, DIFF
% Basophils: 1 %
% Eosinophils: 4 %
% Immature Granulocytes: 0 %
% Lymphocytes: 44 %
% Monocytes: 7 %
% Neutrophils: 44 %
% Nucleated RBC: 0 %
Absolute Eosinophil Count: 0.43 10*3/uL (ref 0.00–0.50)
Absolute Lymphocyte Count: 4.32 10*3/uL (ref 1.00–4.80)
Basophils: 0.06 10*3/uL (ref 0.00–0.20)
Hematocrit: 38 % (ref 36.0–45.0)
Hemoglobin: 12.3 g/dL (ref 11.5–15.5)
Immature Granulocytes: 0.02 10*3/uL (ref 0.00–0.05)
MCH: 24.8 pg — ABNORMAL LOW (ref 27.3–33.6)
MCHC: 32 g/dL — ABNORMAL LOW (ref 32.2–36.5)
MCV: 78 fL — ABNORMAL LOW (ref 81–98)
Monocytes: 0.72 10*3/uL (ref 0.00–0.80)
Neutrophils: 4.33 10*3/uL (ref 1.80–7.00)
Nucleated RBC: 0 10*3/uL
Platelet Count: 307 10*3/uL (ref 150–400)
RBC: 4.95 10*6/uL (ref 3.80–5.00)
RDW-CV: 14.6 % — ABNORMAL HIGH (ref 11.0–14.5)
WBC: 9.88 10*3/uL (ref 4.3–10.0)

## 2022-02-18 LAB — PATHOLOGISTS REVIEW

## 2022-02-22 ENCOUNTER — Encounter (INDEPENDENT_AMBULATORY_CARE_PROVIDER_SITE_OTHER): Payer: Self-pay

## 2022-02-22 ENCOUNTER — Telehealth (INDEPENDENT_AMBULATORY_CARE_PROVIDER_SITE_OTHER): Payer: No Typology Code available for payment source | Admitting: Family Medicine

## 2022-02-22 ENCOUNTER — Other Ambulatory Visit (HOSPITAL_BASED_OUTPATIENT_CLINIC_OR_DEPARTMENT_OTHER): Payer: Self-pay

## 2022-02-22 VITALS — Ht 71.0 in | Wt 304.0 lb

## 2022-02-22 DIAGNOSIS — E282 Polycystic ovarian syndrome: Secondary | ICD-10-CM

## 2022-02-22 DIAGNOSIS — F411 Generalized anxiety disorder: Secondary | ICD-10-CM

## 2022-02-22 DIAGNOSIS — Z87898 Personal history of other specified conditions: Secondary | ICD-10-CM

## 2022-02-22 DIAGNOSIS — Z6841 Body Mass Index (BMI) 40.0 and over, adult: Secondary | ICD-10-CM

## 2022-02-22 DIAGNOSIS — J45909 Unspecified asthma, uncomplicated: Secondary | ICD-10-CM

## 2022-02-22 DIAGNOSIS — F209 Schizophrenia, unspecified: Secondary | ICD-10-CM

## 2022-02-22 DIAGNOSIS — K219 Gastro-esophageal reflux disease without esophagitis: Secondary | ICD-10-CM

## 2022-02-22 DIAGNOSIS — E559 Vitamin D deficiency, unspecified: Secondary | ICD-10-CM

## 2022-02-22 DIAGNOSIS — F3181 Bipolar II disorder: Secondary | ICD-10-CM

## 2022-02-22 MED ORDER — INSULIN PEN NEEDLE 32G X 4 MM MISC
1.0000 | Freq: Every day | 2 refills | Status: DC
Start: 2022-02-22 — End: 2022-05-18
  Filled 2022-02-22: qty 100, 30d supply, fill #0

## 2022-02-22 MED ORDER — VICTOZA 18 MG/3ML SC SOPN
PEN_INJECTOR | SUBCUTANEOUS | 0 refills | Status: DC
Start: 2022-02-22 — End: 2022-05-18
  Filled 2022-02-22: qty 6, 27d supply, fill #0
  Filled 2022-03-14: qty 9, 30d supply, fill #1

## 2022-02-22 NOTE — Progress Notes (Signed)
[facility] - Initial Physician Consultation  02/22/2022    ASSESSMENT AND PLAN:  Ashlee Long is a 26yo woman with PMH significant for class III obesity, PCOS, schizophrenia on SGA, asthma, GERD, deficiencies in vitamin D & iron, depression, and anxiety, who [facility] for consultation.    Wt 304 lb (137.893 kg), Body mass index is 42.4 kg/m.     Diagnosis:   (Z68.41) BMI 40.0-44.9, adult (HCC)  Plan:  - Start victoza daily, uptitrate to 1.8mg  daily   > Alrdreanna has previously been on metformin without benefit   > Can consider adding topiramate 50-100mg  to help with cravings and reduction in smoking behavior  - Referral to SW for disordered eating evaluation, mental health evaluation  - RD referral through PCP for prediabetes, PCOS, insulin resistance  - Cut down on sugary drinks and sodas, replace with water or non-sugary drinks.  - Increase protein and fruit/vegetable intake with meals, decrease simple carbohydrates with meals.  - Continue exercise and physical activity as you're able to, with goal of 150 minutes per week  - Establish with a mental health provider as you're able to   - restart vitamin D3 2000 IU daily for Vitamin D Deficiency until resolved       Percent of body weight loss required for therapeutic benefit:  Diabetes prevention: 10% (80% reduction in risk)  T2DM: 5-15% or more (reduce A1c, reduce number and/or doses of glucose lowering medications)  Dyslipidemia: 5-15% or more (lower TG, higher HDL-c, lower non-HDL-c)  Hypertension: 5-15% or more (lower SBP and DBP, reduction in number and/or doses of anti-hypertensive medications)  Non-alcoholic fatty liver disease (NAFLD/steatosis): 5% or more (reduction in intrahepatocellular lipid)  Non-alcoholic steatohepatitis (NASH) 10-40% (reduction in inflammation and fibrosis)  Osteoarthritis: 10% or more (increased function, improvement in symptoms)  Asthma/reactive airway  disease 7-8% or more (improvement in forced expiratory volume at 1 second, improved symptoms)  Obstructive sleep apnea: 7-11% or more (decreased apnea-hypopnea index)  Urinary stress incontinence: 5-10% or more (reduced frequency of incontinence episodes)   Gastroesophageal reflux disease (GERD): 10% or more (reduced symptom frequency and severity)  Polycystic ovarian syndrome (PCOS): 5-15% or more (ovulation, regulate menses, reduce hirsutism, enhance insulin sensitivity, reduce serum androgen levels)  Female Hypogonadism: 5-10% or more (increase in serum testosterone)  Depression: Uncertain % (reduce depression symptomatology, improvement in depression scores)    Dannette Barbara(Garvey, et al, 2016 AACE/ACE Comprehensive Clinical Practice Guidelines for Medical Care of Patient's with Obesity)    Goals:  Highest weight: 330lbs  Initial weight: 02/22/22: Wt 304 lb (137.893 kg)   A weight loss goal of 10-15% or 30-45 pounds over the next 12 months. The patient's additional goals are improving health for future pregnancy and GERD.    Barriers:  Due to metabolic adaptation during weight loss, medical management for weight loss typically can yield weight loss up to 15-20%.  Weight loss goals over 20% might not be attainable with medication management alone.   Busy schedule working part time and   Currently smoking marijuana and tobacco products on       Plan:  Patient was counseled today on the pathophysiology of Obesity as a chronic disease.      We agreed on the following plan to improve the patient's health and comorbid conditions:  Enrollment in intensive 1-year medical weight management program.   Start date: 02/22/22  Graduation/End date: 02/23/2023  1.  Nutrition:  -Dietician consult (  through PCP0  Goal 20-30 g protein three times daily (with each meal)    Here is helpful protein handout:     https://healthonline.BestTheory.uy.pdf    Use smaller plates (4-09 inch)   Use My plate  method:     https://www.diabetesfoodhub.org/articles/what-is-the-diabetes-plate-method.html#:~:text=The%20Diabetes%20Plate%20Method%20is,you%20need%20is%20a%20plate!    Supplements: restart vitamin D3 2000 IU daily for Vitamin D Deficiency. Other supplements to be determined at your first visit with our dietician.     Hydration is important to overall health and recommendation of a minimum of 64 ounces of hydrating fluids a day.    To track food/protein: Use either pen and paper or an app like  Baritastic.  Baritastic is my favorite app- no code needed to download.    Each Meal Should Contain Protein - protein is the main source of irreplaceable buildling materials, which our bodies require to maintain our organs, muscle tissue, skin, and hair.  Protein takes longer to digest and metabolize, thereby making you feel more satisfied.  Focus on eating protein first so that you feel full.      Eat Only One Meal at a Time - Do Not Graze.  Establish a pattern of eating, so that the body will be accustomed to it.  Mindful eating is designating a time and place where you eat, such as the dining room. When you leave your designated place for dining, all eating should cease. Avoid eating on the run, or mindless eating such as watching any screens like TV, phone or working on the computer.  Avoid grazing and eating food out of a bag or pot/pan. Learn to read labels.           Physical Activity:  I recommend tracking your physical activity to make sure you are hitting your weekly needs.   Goal:   get at least 5000 steps per day  And/or at least 150 min/wk moderate-vigorous activity performed on 3-5 separate days of the week  Resistance exercise: single set repetitions involving major muscle groups: 1-2 times per week       Today we discussed you would: continue to walk 20 minutes to and from work for a goal of 150 minutes of activity per week         Behavioral:  -Self-monitor food intake, exercise, weight (goal is at least 1 lb  weight loss per week).        - Education: attend portion class and mindful eating class.     - Practice good sleep hygiene: avoid staying up too late, goal 7-8 hours of sleep per night, if applicable wear CPAP   -See Child psychotherapist for Goal setting and for assistance with barriers and evaluation of mental health comorbidities and eating disorder screen  - Psychological evaluation, counseling and treatment recommended     2. Medications: Patient is a candidate to start following pharmacologic treatment to help manage her POCS, Prediabetes and  Obesity: Victoza.  There are no contraindications to treatment and patient was counseled side effects. Patient will need to lose at least 5% of total body weight in 12 weeks in order to prove efficacy of treatment.    3. Recommendations regarding weight promoting medications: quetiapine is weight promoting but benefits > risk  4. Weight loss surgery:   Given her current smoking habits and the uncertain control of her mental health comorbidities, we will defer referral to bariatric surgery at this time pending SW evaluation.  5. Other recommendations:   vitamin D deficiency and low thiamine are commonly associate  with obesity.  Recommend routine monitoring (Dx code K90.4 Malabsorption) and treat accordingly    Discuss plan with Wyvonna Plum, MD. Obesity is a chronic metabolic disease and your PRIMARY CARE PROVIDER will be a key person who will support you in your long-term weight loss journey.  It is important that your PRIMARY CARE PROVIDER supports you also during this 1 year intensive program.   Please note, upon graduation from this 1-year medically supervised weight loss program, follow up visits will step down to 6-12 months and then yearly thereafter to provide for access for new patients to be enrolled.  We will be available if needed but we are a very limited service and are unable to have ongoing intensive follow up after a year.   Thank you for your understanding.       Follow up in 3 months  I apologize in advance for any delays booking follow up visits.  We are currently dealing with staffing shortages.  I appreciate your patience and understanding.      Patient verbalized understanding and is agreeable to plan. No barriers in communication noted.    Further recommendations:   Obesity/overweight can increase risk of certain cancers and it is recommended that this patient keep up-to-date with cancer screening.       Thank you for referring your patient to the [facility].  It was a pleasure to be involved in your patient's care.     See AVS/ecare message sent to patient today for detailed instructions  -----------------------------------------------------------------------------------------------------------------------------------------------------------    I have spent 60 minutes on this visit today including chart review prior to the visit, face to face time with the patient, and documentation and coordination of care after the visit.    Ashlee Long is a 26 year old female who was referred by Dr. Marcelyn Ditty for consultation regarding weight management for obesity and related co-morbidities.     Distant Site Telemedicine Encounter  I conducted this encounter via secure, live, face-to-face video conference with the patient. I reviewed the risks and benefits of telemedicine as pertinent to this visit and the patient agreed to proceed.    Provider Location: On-site location (clinic, hospital, on-site office)  Patient Location: At work  Present with patient: No one else present       CHIEF COMPLAINT: New Patient Consult  "I just wanna lose weight"  Over the years has tried numerous diets, more intensive exercise, and medications (phentermine + topiramate) but hasn't found lasting effect. Most weight she has every lost is 40 lbs. Feels her birth control contributes to her gaining weight, current has an IUD in  place.    Her goal weight of 200lbs. She has had two previous early pregnancy losses in addition to trouble getting pregnant due to her PCOS and feels a healthier weight would help her have a successful pregnancy. She has friends who have last ~100 lbs with gastric sleeves, so she is open to bariatric surgery.    She is prescribed quetiapine for her history of schizophrenia and depression. States that she does not take this regularly, but only when she is severely depressed because it will help to "block out the world" when she becomes sedated and numbed from it.    No kidney stones, pancreatitis, gastroparesis. No current opioid use. No family history of thyroid cancer. Personal history of seizures when she took aripiprazole around age 6. None since.    HPI/ Weight history:  Weight today: Wt 304 lb (137.893 kg)   Onset of weight gain:  Maximum adult weight:  330 lbs  Lowest adult weight: 194 lbs   Weight 1 year ago 290 lbs  Weight 5 years ago 240 lbs  Weight 10 years ago 170 lbs  Desired weight: 180-200  Motivation for weight management: to feel healthy, pregnancy  Lost weight when moved to [place] in January 2023.  Has been stably 300-312 since beginning of 2023.    Interested in medications, surgery, exercise    Co-Morbid Conditions  OSA: never diagnosed  Pre-Diabetes, previously, most recent A1c 5.6% in June 2023  HTN: never diagnosed  Degenerative Arthritis: never diagnosed  GERD: compliant with treatment, started PPI 02/2022  Hyperlipidemia: monitoring, last checked June 2023  Other: PCOS, Asthma               Triggers for weight gain:  Stress, illness, medication, relationship    Prior weight loss attempts:  The patient has tried multiple methods of non-surgical weight loss in the past, including:  Diets: Weight watchers, Nutrisystem, Intermittent fasting, Keto diet, pescatarian diet  Medications: phentermine + bupropion - started end of 2021, took for 6 months; felt more appetite and gained weight   Patient  has been able to lose up to 50 lbs with these attempts.  Greatest challenges dieting: cost    Current dietary habits:  Eats while at work mainly (works in Surveyor, mining at [employer])  B = muffin + coffee  L = tacos, flatbread pizza  D = rarely dinner, often too tired  Food Intolerances or Allergies: Lactose, but still eats; does not eat pork  Past history of eating disorders? None formally diagnosed, but will binge eat when stressed/bored  Night eating: when takes quetiapine, she will find the urge to eat.  Vitamin supplementation: iron & vitamin D  Sweet beverages: 2x Sprite per day; coffee with sugar every morning; 96 fl oz water daily  Fast food: rarely  Eating triggers: stress, boredom, eating out  Food cravings: ice cream, chocolate    Physical activity:  -type: Her exercise regime consists of daily activities of daily living and includes 40 minutes walking to work each day.  - barriers/physical limitations: limited time given schedule working and taking classes  Steps per day: 5500    Sleep:  Hours per night: 5-7  Number of awakenings at night: 2       Restful sleep: no  Bedtime: 9pm - 1am  OSA: no personal history  CPAP: none    Screening measures:    STOP-BANG: 3  BED-7:  During the last 3 months, did the patient have any episodes of excessive eating (I.e., eating significantly more than what most people would eat in a similar time period)? no  Did the patient feel distressed about these episodes of excessive eating? yes    PHQ-9:   Little interest or pleasure in doing things: 1- Several days    Feeling down, depressed or hopeless: 1- Several days    Trouble falling or staying asleep, or sleeping too much: 0- Not at all    Feeling tired or having little energy : (!) 3- Nearly every day    Poor appetite or overeating: (!) 3- Nearly every day    Feeling bad about yourself - or that you are a failure or have let yourself or your family down: (!) 2- More than half the days    Trouble concentrating on things, such as  reading the newspaper  or watching television: 0- Not at all    Moving or speaking much more slowly than usual.  Or the opposite - fidgety or restless: 0- Not at all    Thoughts that you would be better off dead or of hurting yourself in some way: 0- Not at all    PHQ9 Total Score: 10    If you checked off any problems, how difficult have these problems made it for you to do your work, take care of things at home, or get along with other people?: Somewhat difficult      PAST MEDICAL HISTORY:  Past Medical History:   Diagnosis Date    Allergic rhinitis due to allergen     Anxiety     Asthma     Depression     Disorder of menstrual bleeding     GERD (gastroesophageal reflux disease)     Headache     Heart burn     Irregular menses     Lipidemia     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[place])     NEGATIVE PAST MEDICAL HISTORY OF     PCOS (polycystic ovarian syndrome)     Pre-diabetes     Schizophrenia ([place])     Vitamin D deficiency        Patient Active Problem List    Diagnosis Date Noted    Vitamin D deficiency [E55.9] 10/13/2021    BMI 40.0-44.9, adult ([place]) [Z68.41] 09/07/2021    Chlamydia infection [A74.9] 08/25/2021    ASCUS/hrHPV+ (16/18 negative) Pap [G40.102, R87.810] 08/19/2021     07/2021: ASCUS/hrHPV+ (16/18 negative) Pap   08/2021: colposcopy benign   Repeat co-test due 08/2022      Polycystic ovary syndrome [E28.2] 07/21/2021    Acute pelvic pain [R10.2] 07/10/2021     Added automatically from request for surgery [id number]      Dietary iron deficiency without anemia [E61.1] 07/10/2021    Cyst of right ovary [N83.201] 07/09/2021    Asthma [J45.909] 07/09/2021    Vaginitis [N76.0] 07/05/2021    Abnormal uterine bleeding [N93.9] 06/14/2021     Added automatically from request for surgery [id number]      Generalized anxiety disorder [F41.1] 06/14/2021    Menorrhagia [N92.0] 06/14/2021    Metrorrhagia [N92.1] 06/14/2021    Depressive disorder [F32.A] 06/14/2021       MEDICATIONS:  Current Outpatient Medications   Medication  Sig Dispense Refill    acetaminophen 500 MG tablet Take 1-2 tablets (500-1,000 mg) by mouth every 6 hours as needed for pain. 40 tablet 0    albuterol HFA 108 (90 Base) MCG/ACT inhaler Inhale 2 puffs by mouth every 4 hours as needed for shortness of breath/wheezing. 8.5 g 0    cholecalciferol 50 mcg (2,000 unit) capsule Take 1 capsule (2,000 units) by mouth daily. (Patient not taking: Reported on 01/19/2022) 90 capsule 3    clonazePAM 0.5 MG tablet Take 1 tablet (0.5 mg) by mouth as needed.      ferrous sulfate 324 (65 Fe) MG EC tablet Take 1 tablet (324 mg) by mouth every other day. Take with vitamin C 45 tablet 0    ibuprofen 600 MG tablet Take 1 tablet (600 mg) by mouth every 6 hours as needed for pain. 40 tablet 0    lactobacillus rhamnosus GG (Culturelle) capsule Take 1 capsule by mouth daily.      levonorgestrel (Mirena) 52 mg (20 mcg/day) IUD Insert 1 intrauterine device into the uterus one time.  omeprazole 20 MG DR capsule Take 1 capsule (20 mg) by mouth daily on an empty stomach. Take for 6 weeks. Follow up at 4 weeks or sooner to see if need to adjust dose or continue as can have rebound symptoms 42 capsule 0    Other Meds (See Sig/Instructions) Medication name: MIRENA      QUEtiapine 200 MG tablet Take 1 tablet (200 mg) by mouth.      QUEtiapine 50 MG tablet Take 1 tablet (50 mg) by mouth.      Spacer/Aero-Holding Chambers device Use with inhaler as directed. 1 each 0     No current facility-administered medications for this visit.       ALLERGIES:  Review of patient's allergies indicates:  Allergies   Allergen Reactions    Abilify [Aripiprazole] Seizures       FAMILY MEDICAL HISTORY:  family history includes Bleeding Tendency in her mother and another family member; Diabetes in her maternal aunt and maternal grandmother; Fibromyalgia in her mother; Heart Disease in her maternal grandmother; Substance Abuse in her maternal aunt and maternal uncle.    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  All other systems reviewed with the patient and otherwise negative except as outlined above and noted in the HPI    PHYSICAL EXAM:  Ht 5\' 11"  (1.803 m)   Wt (!) 137.9 kg (304 lb)   BMI 42.40 kg/m   Wt Readings from Last 5 Encounters:   02/22/22 (!) 137.9 kg (304 lb)   02/17/22 (!) 138.3 kg (304 lb 12.8 oz)   01/19/22 (!) 138.8 kg (306 lb)   12/12/21 (!) 136.5 kg (301 lb)   12/06/21 (!) 136.5 kg (301 lb)     BP Readings from Last 3 Encounters:   02/17/22 123/83   01/19/22 (!) 69/55   12/29/21 122/80       Physical Exam  Constitutional:       General: She is not in acute distress.     Appearance: Normal appearance.   HENT:      Head: Normocephalic and atraumatic.   Pulmonary:      Effort: Pulmonary effort is normal.   Neurological:      General: No focal deficit present.      Mental Status: She is alert and oriented to person, place, and time.   Psychiatric:         Mood and Affect: Mood normal.         Behavior: Behavior normal.         Thought Content: Thought content normal.         LABS:  A1c 5.6% June 2023  Lipid panel T 168, TG 232, LDL 89, HDL 40 June 2023  TSH 4.14 October 2021  Hgb  12.3, MCV 78 June 2023  Ferritin 13 Sept 2023  B12 225 June 2023  ALT/AST normal Oct 2023  Vitamin D 19.8 Sept 2023

## 2022-02-22 NOTE — Progress Notes (Signed)
I saw and evaluated the patient and agree with the report above  TIME: 105-119PM, 156- 236PM, 303- 309PM  I spent a total of 60 minutes for the patient's care on the date of the service.    Marland Kitchen

## 2022-02-23 ENCOUNTER — Other Ambulatory Visit (HOSPITAL_BASED_OUTPATIENT_CLINIC_OR_DEPARTMENT_OTHER): Payer: Self-pay

## 2022-02-24 ENCOUNTER — Other Ambulatory Visit (HOSPITAL_BASED_OUTPATIENT_CLINIC_OR_DEPARTMENT_OTHER): Payer: Self-pay

## 2022-02-25 ENCOUNTER — Other Ambulatory Visit: Payer: Self-pay

## 2022-03-06 ENCOUNTER — Ambulatory Visit
Admission: RE | Admit: 2022-03-06 | Discharge: 2022-03-06 | Disposition: A | Discharge status: Home / Self Care | Payer: No Typology Code available for payment source | Attending: Diagnostic Radiology | Admitting: Diagnostic Radiology

## 2022-03-06 DIAGNOSIS — D72821 Monocytosis (symptomatic): Secondary | ICD-10-CM | POA: Insufficient documentation

## 2022-03-06 DIAGNOSIS — R1011 Right upper quadrant pain: Secondary | ICD-10-CM | POA: Insufficient documentation

## 2022-03-08 NOTE — Result Encounter Note (Signed)
Normal [place]. Advised f/u given abdominal symptoms and to see response to PPI

## 2022-03-14 ENCOUNTER — Other Ambulatory Visit (HOSPITAL_BASED_OUTPATIENT_CLINIC_OR_DEPARTMENT_OTHER): Payer: Self-pay

## 2022-03-14 ENCOUNTER — Telehealth (HOSPITAL_BASED_OUTPATIENT_CLINIC_OR_DEPARTMENT_OTHER): Payer: Self-pay | Admitting: Student in an Organized Health Care Education/Training Program

## 2022-03-14 DIAGNOSIS — K219 Gastro-esophageal reflux disease without esophagitis: Secondary | ICD-10-CM

## 2022-03-14 NOTE — Telephone Encounter (Signed)
Pt is requested medication refill.   Pt reports having 3 supply days left   omeprazole 20 MG   liraglutide (Victoza) 18 MG/3ML pen-injector     Pt requests usual pharmacy at [facility].

## 2022-03-15 ENCOUNTER — Other Ambulatory Visit (HOSPITAL_BASED_OUTPATIENT_CLINIC_OR_DEPARTMENT_OTHER): Payer: Self-pay

## 2022-03-15 ENCOUNTER — Encounter (HOSPITAL_BASED_OUTPATIENT_CLINIC_OR_DEPARTMENT_OTHER): Payer: Self-pay

## 2022-03-16 ENCOUNTER — Other Ambulatory Visit (HOSPITAL_BASED_OUTPATIENT_CLINIC_OR_DEPARTMENT_OTHER): Payer: Self-pay

## 2022-03-16 ENCOUNTER — Telehealth (HOSPITAL_BASED_OUTPATIENT_CLINIC_OR_DEPARTMENT_OTHER): Payer: Self-pay | Admitting: Student in an Organized Health Care Education/Training Program

## 2022-03-16 MED ORDER — OMEPRAZOLE 20 MG OR CPDR
20.0000 mg | DELAYED_RELEASE_CAPSULE | Freq: Every day | ORAL | 0 refills | Status: DC
Start: 2022-03-16 — End: 2022-03-21
  Filled 2022-03-16: qty 42, 42d supply, fill #0

## 2022-03-16 NOTE — Telephone Encounter (Signed)
Spoke with pt -     Discussed pt will need to contact Dr. Brain Hilts office for Victoza refills.     Omeprazole rx for #42 tabs sent earlier today. Discussed plan to gradually wean off, likely will try taking it every other day, and monitor for any worsening symptoms. Pt believes pharmacy provided a partial fill but will check with pharmacy if 42 tablets was not dispensed to her.     Pt would like to know if a particular supplement would be safe for her to take with her current medications (sent message to pt to spell the name, sounded like "Illizene" but unable to find this online).     Pt agrees to schedule f/u - will see ARNP next week. In addition to symptom f/u and discussion of omeprazole, will also request STD panel and any indicated blood tests.     Pt agreeable to the plan and has no further questions.

## 2022-03-16 NOTE — Telephone Encounter (Signed)
Pt due for follow up. Sent another 6 weeks but will not fill additional until checks in with provider. Should trial off PPI to see if benefited.     Dr. Ninfa Meeker is prescriber of liraglutide.

## 2022-03-16 NOTE — Telephone Encounter (Signed)
RETURN CALL: Voicemail - Detailed Message      SUBJECT:  Medication Questions     NAME OF MEDICATION(S): omeprazole 20 MG   liraglutide (Victoza) 18 MG/3ML pen-injector   ADDITIONAL INFORMATION: Patient called in to check status of refill request. Requesting a callback to confirm that it will be ready for her when arrives.

## 2022-03-16 NOTE — Telephone Encounter (Signed)
Duplicate entry. See ongoing encounter

## 2022-03-20 NOTE — Progress Notes (Signed)
Chief Complaint   Patient presents with    Follow Up Visit     Lab work request.  Partner reports Herpes    Muscle Spasms     Pt reports body spasms that started  one week ago when she cut dosage on  omperzole     Referral     dermatology          Subjective:     Ashlee Long is a 26 year old female who presents on 03/21/2022 for follow up on GERD, abdominal pain, and requests herpes screening.     Last seen by PCP on 02/17/22 for RUQ abdominal pain, GERD. Abdominal US 03/06/22 was unremarkable, and labs showed improving iron deficiency and normal metabolic panel. Started on omeprazole 20 mg once daily, with discussion for possible GI referral at f/u if symptoms persisted. She confirms that she is also taking iron supplement daily.   Since last visit, she continues to have abdominal discomfort, located mid-abdomen radiating across, comes and goes. Initially took omeprazole daily since prescribed on 02/17/22, and since has been decreasing frequency of dose to every other day. Plans to start taking it every two days, which pt states was recommendation of PCP. Noted intermittent muscle spasms since tapering down. Notes feeling nauseated when standing for extended periods of time. Denies blood in the stool. Stool is dark green, which started when she initiated iron supplementation. Denies vomiting. No difficulty swallowing. She does note loss of appetite. Reflux seemed to worsen with taking PPI daily.   Mom has NAFLD, GERD, FM.     Patient is also followed at [facility], Dr. Ninfa Meeker, last visit 02/22/22. Started on liraglutide.     Also notes that her partner had a positive herpes test on serum testing. She is asymptomatic, but requests blood testing. Has a history of oral cold sores. No prior genital sores resembling herpes lesion that she is aware of. 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 Referral was placed, but she has not heard back.      Taking L-lysine defense immune support supplement.        Objective:    Vitals: BP 117/76   Pulse (!) 105   Wt (!) 135.4 kg (298 lb 6.4 oz)   SpO2 99%   BMI 41.62 kg/m  Repeat pulse 90 bpm.   Physical Exam  Vitals reviewed.   Constitutional:       General: She is not in acute distress.  Cardiovascular:      Rate and Rhythm: Normal rate and regular rhythm.      Heart sounds: Normal heart sounds.      Comments: Repeat pulse 90 BPM on exam.  Pulmonary:      Effort: Pulmonary effort is normal.      Breath sounds: Normal breath sounds.   Abdominal:      General: Bowel sounds are normal.      Palpations: Abdomen is soft. There is no mass.      Tenderness: There is no abdominal tenderness. There is no guarding.   Skin:     General: Skin is warm and dry.      Comments: On bilateral inner thighs, there are scattered induration with scarred appearance and hyperpigmentation. No fluctuance, TTP, heat or drainage.   Neurological:      General: No focal deficit present.      Mental Status: She is alert.   Psychiatric:  Mood and Affect: Mood normal.         Behavior: Behavior normal.          Assessment and Plan:      Ashlee Long was seen today for follow up visit, muscle spasms and referral.    Gastroesophageal reflux disease, unspecified whether esophagitis present  - Will switch from PPI to famotidine twice daily given no improvement with PPI  - Referral to GI for EGD and further evaluation  -     famotidine 20 MG tablet; Take 1 tablet (20 mg) by mouth 2 times a day.  Dispense: 60 tablet; Refill: 1  -     EGD; Future    Upper abdominal pain  - Referral to GI     Screening examination for venereal disease  -     HERPES TYPE 1 & 2 SEROLOGY; Future    Iron deficiency  -     CBC with Diff; Future    Hidradenitis suppurativa  - Discussed with patient to please contact Derm to schedule referral     Muscle spasm  - Of note, this was not a focus of this visit, and patient agrees to follow up with PCP if symptoms  persist    Diagnosis and treatment plan discussed with patient who verbalized understanding of condition, risks and expected goals of therapy, and agrees to plan of care. Warning signs reviewed.

## 2022-03-21 ENCOUNTER — Other Ambulatory Visit (HOSPITAL_BASED_OUTPATIENT_CLINIC_OR_DEPARTMENT_OTHER): Payer: Self-pay | Admitting: Family

## 2022-03-21 ENCOUNTER — Other Ambulatory Visit (HOSPITAL_BASED_OUTPATIENT_CLINIC_OR_DEPARTMENT_OTHER): Payer: Self-pay

## 2022-03-21 ENCOUNTER — Ambulatory Visit: Payer: No Typology Code available for payment source | Attending: Family | Admitting: Family

## 2022-03-21 VITALS — BP 117/76 | HR 105 | Wt 298.4 lb

## 2022-03-21 DIAGNOSIS — M62838 Other muscle spasm: Secondary | ICD-10-CM | POA: Insufficient documentation

## 2022-03-21 DIAGNOSIS — K219 Gastro-esophageal reflux disease without esophagitis: Secondary | ICD-10-CM | POA: Insufficient documentation

## 2022-03-21 DIAGNOSIS — Z113 Encounter for screening for infections with a predominantly sexual mode of transmission: Secondary | ICD-10-CM | POA: Insufficient documentation

## 2022-03-21 DIAGNOSIS — E611 Iron deficiency: Secondary | ICD-10-CM | POA: Insufficient documentation

## 2022-03-21 DIAGNOSIS — R1010 Upper abdominal pain, unspecified: Secondary | ICD-10-CM | POA: Insufficient documentation

## 2022-03-21 DIAGNOSIS — L732 Hidradenitis suppurativa: Secondary | ICD-10-CM | POA: Insufficient documentation

## 2022-03-21 LAB — CBC, DIFF
% Basophils: 1 %
% Eosinophils: 3 %
% Immature Granulocytes: 0 %
% Lymphocytes: 40 %
% Monocytes: 7 %
% Neutrophils: 49 %
% Nucleated RBC: 0 %
Absolute Eosinophil Count: 0.35 10*3/uL (ref 0.00–0.50)
Absolute Lymphocyte Count: 4.14 10*3/uL (ref 1.00–4.80)
Basophils: 0.06 10*3/uL (ref 0.00–0.20)
Hematocrit: 39 % (ref 36.0–45.0)
Hemoglobin: 12.4 g/dL (ref 11.5–15.5)
Immature Granulocytes: 0.01 10*3/uL (ref 0.00–0.05)
MCH: 25 pg — ABNORMAL LOW (ref 27.3–33.6)
MCHC: 31.5 g/dL — ABNORMAL LOW (ref 32.2–36.5)
MCV: 79 fL — ABNORMAL LOW (ref 81–98)
Monocytes: 0.75 10*3/uL (ref 0.00–0.80)
Neutrophils: 5.13 10*3/uL (ref 1.80–7.00)
Nucleated RBC: 0 10*3/uL
Platelet Count: 312 10*3/uL (ref 150–400)
RBC: 4.96 10*6/uL (ref 3.80–5.00)
RDW-CV: 14.2 % (ref 11.0–14.5)
WBC: 10.44 10*3/uL — ABNORMAL HIGH (ref 4.3–10.0)

## 2022-03-21 MED ORDER — FAMOTIDINE 20 MG OR TABS
20.0000 mg | ORAL_TABLET | Freq: Two times a day (BID) | ORAL | 1 refills | Status: DC
Start: 2022-03-21 — End: 2022-05-18
  Filled 2022-03-21: qty 60, 30d supply, fill #0

## 2022-03-21 NOTE — Telephone Encounter (Signed)
We discussed this. Thank you

## 2022-03-21 NOTE — Telephone Encounter (Signed)
Pt wanted to ensure this supplement is ok to take with her medications. She probably discussed with you, if so pls disregard. Thanks!

## 2022-03-22 ENCOUNTER — Other Ambulatory Visit (HOSPITAL_BASED_OUTPATIENT_CLINIC_OR_DEPARTMENT_OTHER): Payer: Self-pay

## 2022-03-22 NOTE — Patient Instructions (Addendum)
-   Please schedule the referral to Gastroenterology and upper endoscopy procedure.   - The Dermatology referral has been placed, please contact to schedule at [phone].     - Stop taking the omeprazole and start the famotidine twice daily to see if this helps with the reflux and abdominal pain.   - We did not discuss the muscle spasms much, so please follow up with your PCP if this persists for further evaluation.

## 2022-03-23 ENCOUNTER — Other Ambulatory Visit (HOSPITAL_BASED_OUTPATIENT_CLINIC_OR_DEPARTMENT_OTHER): Payer: Self-pay

## 2022-03-24 ENCOUNTER — Telehealth (HOSPITAL_BASED_OUTPATIENT_CLINIC_OR_DEPARTMENT_OTHER): Payer: Self-pay | Admitting: Student in an Organized Health Care Education/Training Program

## 2022-03-24 NOTE — Telephone Encounter (Signed)
Pt is looking for STD results done on Monday , stated that it does not take this long to have results.

## 2022-03-25 LAB — HERPES TYPE 1 & 2 SEROLOGY
HSV1 WB Result: POSITIVE
HSV2 WB Result: POSITIVE

## 2022-03-28 ENCOUNTER — Telehealth (HOSPITAL_BASED_OUTPATIENT_CLINIC_OR_DEPARTMENT_OTHER): Payer: Self-pay | Admitting: Family

## 2022-03-28 ENCOUNTER — Other Ambulatory Visit (HOSPITAL_BASED_OUTPATIENT_CLINIC_OR_DEPARTMENT_OTHER): Payer: Self-pay

## 2022-03-28 NOTE — Telephone Encounter (Signed)
-----   Message from March Rummage, [place] sent at 03/28/2022  8:05 AM PST -----  Please call patient regarding abnormal result. Let her know that the blood screening for HSV 1 and 2 are both positive. She did confirm having been exposed to a partner with positive herpes screening on serum testing (she was not certain which type), as well as personally having a cold sore in the past. This test was done per her request due to the known exposure. Please provide education regarding these results, and if she would like to follow up for an appointment and has additional questions, I am happy to see her or she can f/u with her PCP, either in person or for a televisit.    Thanks,  March Rummage, ARNP

## 2022-03-28 NOTE — Telephone Encounter (Signed)
See other encounter.

## 2022-03-28 NOTE — Telephone Encounter (Signed)
Called pt:     Relayed message from [place].     Scheduled appt with PCP to further discuss treatment options.

## 2022-04-13 ENCOUNTER — Ambulatory Visit
Payer: No Typology Code available for payment source | Admitting: Student in an Organized Health Care Education/Training Program

## 2022-04-13 ENCOUNTER — Encounter (HOSPITAL_BASED_OUTPATIENT_CLINIC_OR_DEPARTMENT_OTHER): Payer: Self-pay

## 2022-04-13 DIAGNOSIS — K219 Gastro-esophageal reflux disease without esophagitis: Secondary | ICD-10-CM

## 2022-04-13 NOTE — Progress Notes (Signed)
[facility] Telemedicine Encounter    DATE: 04/13/2022    Patient was not in [place] state- visit needs to be rescheduled. No charge for visit.

## 2022-04-14 NOTE — Progress Notes (Signed)
Pt not seen by me. Unable to complete visit as patient was not in [place] state.

## 2022-04-20 ENCOUNTER — Ambulatory Visit: Payer: No Typology Code available for payment source | Attending: Internal Medicine | Admitting: Clinical Social Worker

## 2022-04-20 DIAGNOSIS — F411 Generalized anxiety disorder: Secondary | ICD-10-CM

## 2022-04-20 DIAGNOSIS — F209 Schizophrenia, unspecified: Secondary | ICD-10-CM

## 2022-04-20 DIAGNOSIS — F3181 Bipolar II disorder: Secondary | ICD-10-CM
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
## 2022-05-10 ENCOUNTER — Ambulatory Visit: Payer: No Typology Code available for payment source | Admitting: Clinical Social Worker

## 2022-05-10 DIAGNOSIS — F411 Generalized anxiety disorder: Secondary | ICD-10-CM

## 2022-05-10 NOTE — Progress Notes (Signed)
Distant Site Telemedicine Encounter  I conducted this encounter via secure, live, face-to-face video conference with the patient. I reviewed the risks and benefits of telemedicine as pertinent to this visit and the patient agreed to proceed.    Provider Location: On-site location (clinic, hospital, on-site office)  Patient Location: At home  Present with patient: Family member        [place] [facility] [place]  Progress Note    Date: 05/10/22    Duration (Mins): 60 minutes      Diagnosis: (F41.1) Generalized anxiety disorder  (primary encounter diagnosis)        Presentation: Ashlee Long presents on time for this appointment. [name] is still in [place] so tw proposed we re schedule once she gets back in state. [name] works until 3:30pm M-F and tw gets off work at [employer]. Tw will speak with team to see if there is another CM who could meet with [name] after 3:30pm.      MENTAL STATUS EXAMINATION:  (all drop box)  General appearance:  N/A  Behavior/activity: engaged  Speech: generally normal rate and prosody  Affect: euthymic  Mood: euthymic  Thought Form: linear and goal directed  Thought Content: No SI/HI and goal directed   Orientation: person, place, time, and situation  Attention/concentration: attentive  Memory: intact  Insight and judgment: good  Additional comments/details:     Assessment: Tw only spoke with [name] before ending the call.     Intervention: unconditional positive regard, reflective listening    Goals Addressed: Psychiatric Symptomatology      Plan/Follow-Up: Tw to see if there is another CM that can work with Ashlee Long    Clinician:     Beryle Beams, [place] Practitioner  [facility] and [place]   [phone]

## 2022-05-12 ENCOUNTER — Ambulatory Visit
Payer: No Typology Code available for payment source | Admitting: Student in an Organized Health Care Education/Training Program

## 2022-05-12 DIAGNOSIS — E162 Hypoglycemia, unspecified: Secondary | ICD-10-CM

## 2022-05-12 DIAGNOSIS — N921 Excessive and frequent menstruation with irregular cycle: Secondary | ICD-10-CM

## 2022-05-12 DIAGNOSIS — B002 Herpesviral gingivostomatitis and pharyngotonsillitis: Secondary | ICD-10-CM

## 2022-05-12 DIAGNOSIS — K219 Gastro-esophageal reflux disease without esophagitis: Secondary | ICD-10-CM

## 2022-05-12 MED ORDER — FERROUS SULFATE 324 (65 FE) MG OR TBEC
324.0000 mg | DELAYED_RELEASE_TABLET | ORAL | 0 refills | Status: DC
Start: 2022-05-12 — End: 2022-05-18

## 2022-05-12 MED ORDER — VALACYCLOVIR HCL 500 MG OR TABS
500.0000 mg | ORAL_TABLET | Freq: Two times a day (BID) | ORAL | 3 refills | Status: DC
Start: 2022-05-12 — End: 2022-05-18

## 2022-05-12 NOTE — Patient Instructions (Signed)
To check on the status of your referral for EGD (upper endoscopy), you can call the GI procedure scheduling team at [phone].    Please let Dr. Hulan Fray know about SE of liraglutide. The lab to check fasting glucose is ordered.     The scripts for Valtrex and refill of iron are in to the mail order pharmacy.

## 2022-05-12 NOTE — Progress Notes (Addendum)
[facility] Telemedicine Encounter    DATE: 05/12/2022    PCP: Brennan Bailey, MD    Planned for telemedicine but pt having connectivity issues -   Pt requested conversion to telephone. Pt at work but alone. Provider in office.     ISSUES DISCUSSED:     #Herpes virus  Had exposure was tested for HSV. Titers positive for HSV1 and 2. Reports tested previously and negative. Did have cold sore as kid- none recent. No genital lesions. Did some online reading. No outbreaks but would like medicine. Also wondering about impact if wanting to carry pregnancy one day.     #GERD  Off famotidine now. Sx no longer issue. Given abd sx and persist on PPI had EGD referral- not been contacted yet    #Med SE  #Hypoglycemic episode  Reports off liraglutide  Had ep in [place] of low BGL.   Slept most day, called EMS and they checked sugar which was 20. Reports did not need med (no glucagon it sounds) but instead had eat/drink something and was alright      Supplements:  Taking vit D  Stopped taking iron as ran out- will send refill    INTERVAL SOCIAL HISTORY:  Back from [place]. Was not particularly restful while there.     RELEVANT LABS/STUDIES:   Orders Only on 03/21/22   1. CBC with Diff   Result Value Ref Range    WBC 10.44 (H) 4.3 - 10.0 10*3/uL    RBC 4.96 3.80 - 5.00 10*6/uL    Hemoglobin 12.4 11.5 - 15.5 g/dL    Hematocrit 39 36.0 - 45.0 %    MCV 79 (L) 81 - 98 fL    MCH 25.0 (L) 27.3 - 33.6 pg    MCHC 31.5 (L) 32.2 - 36.5 g/dL    Platelet Count 312 150 - 400 10*3/uL    RDW-CV 14.2 11.0 - 14.5 %    % Neutrophils 49 %    % Lymphocytes 40 %    % Monocytes 7 %    % Eosinophils 3 %    % Basophils 1 %    % Immature Granulocytes 0 %    Neutrophils 5.13 1.80 - 7.00 10*3/uL    Absolute Lymphocyte Count 4.14 1.00 - 4.80 10*3/uL    Monocytes 0.75 0.00 - 0.80 10*3/uL    Absolute Eosinophil Count 0.35 0.00 - 0.50 10*3/uL    Basophils 0.06 0.00 - 0.20 10*3/uL    Immature Granulocytes 0.01 0.00 - 0.05 10*3/uL    Nucleated RBC 0.00 0.00 10*3/uL    %  Nucleated RBC 0 %   2. HERPES TYPE 1 & 2 SEROLOGY   Result Value Ref Range    HSV WB Specimen Serum     HSV1 WB Result Positive     HSV2 WB Result Positive     HSV WB Interpretation HSV-1 and HSV-2 infections.        PHYSICAL EXAM:   No vitals as phone visit  General: alert, speaking in full sentences with clear, coherent and fluent speech      ASSESSMENT/PLAN:  1. Recurrent oral herpes simplex  No recent sx- reviewed natural history. Will start with script for oral breakout. Advised to cont current precautions to prevent passing to new partners (barrier methods, wait for healed lesions) and to let us know if recurrent sx start or new genital lesions. Also if desires pregnancy that this will not preclude her.   - valACYclovir 500 MG tablet; Take  1 tablet (500 mg) by mouth 2 times a day. Take for 3 days for recurrent infection.  Dispense: 6 tablet; Refill: 3    2. Low blood sugar reading  Rare reports of hypoglycemia on GLP-1 if not on other BG lowering agents. However agree with stopping for now and letting provider know. Since was at time of fast, discussed getting new fasting BGL to see reading off medicine. Also wonder if possible device had error as a BGL of 20 would expect significant altered mental status and need for EMS intervention beyond PO.   - BMP w/Reflexive Ionized Ca; Future    3. Menorrhagia with irregular cycle  Refill iron.   - ferrous sulfate 324 (65 Fe) MG EC tablet; Take 1 tablet (324 mg) by mouth every other day. Take with vitamin C  Dispense: 45 tablet; Refill: 0    4. Gastroesophageal reflux disease, unspecified whether esophagitis present  Sx improved. Gave # for GI in AVS.     RTC 1-2 months, may want to consider PrEP down the line    I saw and discussed the patient with attending physician, Dr. Gustavus Messing, MD  Internal Medicine, R3

## 2022-05-13 ENCOUNTER — Telehealth (HOSPITAL_BASED_OUTPATIENT_CLINIC_OR_DEPARTMENT_OTHER): Payer: Self-pay | Admitting: Student in an Organized Health Care Education/Training Program

## 2022-05-13 NOTE — Telephone Encounter (Signed)
Pt is wondering how the mail order medication works and if she will need to be home in order to sign for meds etc. Pt reports she is at work today and wondering if they will leave meds in the mailbox if she's not at home.     Please call or send a MyChart message to clarify,

## 2022-05-13 NOTE — Telephone Encounter (Signed)
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      Left vm, scheduling ticket sent

## 2022-05-16 ENCOUNTER — Telehealth (INDEPENDENT_AMBULATORY_CARE_PROVIDER_SITE_OTHER): Payer: No Typology Code available for payment source

## 2022-05-16 DIAGNOSIS — Z6841 Body Mass Index (BMI) 40.0 and over, adult: Secondary | ICD-10-CM

## 2022-05-16 NOTE — Addendum Note (Signed)
Addended by: Debroah Loop on: 05/16/2022 09:04 AM     Modules accepted: Level of Service

## 2022-05-18 ENCOUNTER — Ambulatory Visit (HOSPITAL_BASED_OUTPATIENT_CLINIC_OR_DEPARTMENT_OTHER): Payer: No Typology Code available for payment source

## 2022-05-18 ENCOUNTER — Ambulatory Visit
Payer: No Typology Code available for payment source | Attending: Obstetrics & Gynecology | Admitting: Unknown Physician Specialty

## 2022-05-18 VITALS — BP 109/78 | HR 77 | Temp 97.8°F | Wt 294.7 lb

## 2022-05-18 DIAGNOSIS — Z113 Encounter for screening for infections with a predominantly sexual mode of transmission: Secondary | ICD-10-CM | POA: Insufficient documentation

## 2022-05-18 DIAGNOSIS — N939 Abnormal uterine and vaginal bleeding, unspecified: Secondary | ICD-10-CM | POA: Insufficient documentation

## 2022-05-18 DIAGNOSIS — N949 Unspecified condition associated with female genital organs and menstrual cycle: Secondary | ICD-10-CM | POA: Insufficient documentation

## 2022-05-18 DIAGNOSIS — Z30431 Encounter for routine checking of intrauterine contraceptive device: Secondary | ICD-10-CM

## 2022-05-18 DIAGNOSIS — N909 Noninflammatory disorder of vulva and perineum, unspecified: Secondary | ICD-10-CM | POA: Insufficient documentation

## 2022-05-18 LAB — VAGINAL GRAM STAIN: Gram Smear: NONE SEEN

## 2022-05-18 LAB — GC&CHLAM NUCLEIC ACID DETECTN

## 2022-05-18 LAB — HSV SEMIQNT RAPID PCR, SWAB/CSF

## 2022-05-18 LAB — GRAM STAIN

## 2022-05-18 NOTE — Progress Notes (Addendum)
[facility] CLINIC  RETURN GYNECOLOGY VISIT    Patient Referred By: No ref. provider found  Patient's PCP: Wyvonna Plum, MD    Patient's preferred name: Ashlee Long  Patient's pronouns: she/her       Chief Complaint   Patient presents with    Gyn Concern     Mirena IUD check        Subjective:     Ashlee Long is a 27 year old female who presents on 05/18/2022.    Ashlee Long presents today to discuss her mirena IUD.  She had her mirena IUD placed in early 2023 due to a history of AUB.  She endorses that prior to 2014 she had periods that lasted 2 weeks and then skipped several months.  This abnormal uterine bleeding was attributed to her PCOS.  However, ever since have a miscarriage in 2014 and 2017 her menses have worsened.  Over the past 4 years, prior to her mirena IUD placement she endorses heavy, constant uterine bleeding. At one point she bled for 157 days without a break. She was tested for AUB and had a negative EMB in [place] in 2021 and a normal thyroid and coagulapathy workup in Spring of 2023. These symptoms were extremely disruptive to her life and she got her mirena IUD to help control her heavy bleeding.  Since placement the mirena IUD has worked well for her. She denies any bleeding since placement apart from the last 6 weeks.  In the last 6 weeks she has noticed intermittent painful cramping sensations at sporadic times accompanied with dark brown spotting.  She is concerned that the IUD has moved and is considering removal. She has also noted recurrent yeast and BV infections since her IUD placement.  She recently took a 7 day course of metronidazole that had been prescribed previously but never used, due to concern for a yeast infection while she was away for the holidays in [place].  Patient also expressed concern over her fertility.  She is not yet ready to have a child but is worried about her ability to conceive.  She has previously been seen by a naturopathic  provider and had an AMH performed which patient self reported was about 3. She is interested in fertility referral when she os ready to think about conceiving. Patient also desires STI testing today.      OB HISTORY   [id number]  OB History       Gravida   2    Para   0    Term   0    Preterm   0    AB   2    Living   0         SAB        IAB        Ectopic        Multiple        Live Births               # Date GA Outcome Labor Sex Weight Anes    1   Abortion            2   Abortion                    GYN HISTORY   Menstrual Hx: See HPI  Sexual history: Currently sexually active. Partners cis-female. Risk of pregnancy: Yes. Dyspareunia or sexual dysfunction NO.  Cervical cancer screening hx: h/o abnormal pap  smear with ASCUS and HPV+ in 2023, colposcopy negative. Due for repeat pap smear March 2024  Gardasil: YES  Contraception: mIUD. Happy with method?  Mostly . Needs emergency contraception Rx? No.  Fertility plans: In the next several years  History of STIs: HSV, HPV, chlamydia would like testing today  History of sexual assault/abuse: Denies  History of trauma or severe pain with pelvic exams: NO  Breast health: No concerns, does not yet need mammography  GYN surgery: ovarian cystectomy       Objective:    Vitals: BP 109/78   Pulse 77   Temp 36.6 C (Temporal)   Wt (!) 133.7 kg (294 lb 11.2 oz)   SpO2 97%   BMI 41.10 kg/m   Physical Exam  General: healthy, alert, no distress.  Cardiac: Normal pulses, normal capillary refill, No edema or varicosities.  Respiratory: normal respiratory effort and chest wall movement with respiration   Abdomen: soft, non-tender, no HSM or masses.  Psychiatric:  Affect: Appropriate  Neurologic:  Gait:  Normal.  Skin: Skin color, texture, turgor normal. No rashes or concerning lesions on visible areas.  Pelvic Exam: normal bartholin/skene/urethral meatus/anus., Vagina is rugated and well-estrogenized, cervix normal in appearance, no CMT, no bladder tenderness, uterus normal size,  shape, and consistency, no adnexal masses or tenderness.  IUD strings visualized protruding from cervix.  Small 0.5cm nodule located on right and left intergluteal cleft.  Mild ulceration noted on left sided nodule. Additional 1-2cm maculopapular lesions in vulvar folds    Exam chaperoned by attending    Office Visit on 05/18/22   1. US Pelvis Limited    Narrative    Normal appearing uterus with IUD positioned appropriately at the fundus     2. Trichomonas Nucleic Acid Detection   Result Value Ref Range    Trichomonas Nucleic Acid Spec. Vaginal     Trichomonas Nucleic Acid Res.  NRN   3. GC&CHLAM NUCLEIC ACID DETECTN   Result Value Ref Range    GC&Chlam NA Spec Desc Vaginal     Chlam Trachomatis Nucleic Acid  NRN    N.Gonorrhoeae(GC) Nucleic Acid  NRN   4. HSV Semiqnt Rapid PCR, Swab/CSF   Result Value Ref Range    Rapid HSV PCR Specimen Desc. Swab     Rapid HSV 1 PCR Result  NDET    Rapid HSV1 CT Value      Rapid HSV 2 PCR Result  NDET    Rapid HSV2 CT Value      Rapid HSV PCR Interpretation            Assessment and Plan:      Ashlee Long is a 27 year old G2P0020 who presents for IUD maintenance and STI screening    Ashlee Long was seen today for gyn concern.    Routine screening for STI (sexually transmitted infection)  -Patient requested to have full STI screening.  GC/CT and trichomonas swabs collected.  Additional blood tests ordered for lab draw.  -     GC&CHLAM NUCLEIC ACID DETECTN; Future  -     Trichomonas Nucleic Acid Detection; Future  -     HIV Antigen and Antibody Screen; Future  -     Serologic Syphilis Screen; Future  -     Hepatitis B surf Ag w/RFLX; Future    Vaginal symptom  -Patient endorsed frequent BV and yeast infections with recent self treatment with metronidazole for presumed BV infection. BV stain ordered to test for adequate treatment  of BV.  Due to recurrent yeast infections, yeast culture ordered to assess for type of yeast to ensure proper treatment.  Will follow up results.  -      Direct Exam Gram Stain; Future  -     R/O YEAST CULT W/DIRECT EXAM; Future    Lesion of female perineum  -Patient had painful lesion on left intergluteal cleft. Due to recent diagnosis with serum positive HSV but no known outbreaks, lesion swabbed to assess for active infection.  Will follow up results.    -Patient also has concern concern for hidradenitis suppurative with some suspicious lumps in her vulvar folds.  Dermatology consult placed.  -     HSV Semiqnt Rapid PCR, Swab/CSF; Future  -     Referral to Dermatology; Future    IUD check up  -IUD positioned in the correct place on ultrasound.  Patient will keep IUD in place.  Will contact us for any further concerns.  -     US Pelvis Limited    Abnormal uterine bleeding (AUB)  -AUB well controlled on mirena IUD.  Will continue use.      # HCM:  - Cervical cancer screening: Due March 2024, patient to schedule appointment  - HPV Vaccine: Received  - Contraception: Mirena IUD in place  - STI testing: HSV, GC/CT, Trich, HIV, Syphilis and hepatitis tests ordered/collected today    Follow Up Plan: Pap smear in  March    Patient was seen with Dr. Ricard Dillon, attending physician.     Payton Doughty, MD   OB/GYN Fellow Physician

## 2022-05-19 DIAGNOSIS — Z975 Presence of (intrauterine) contraceptive device: Secondary | ICD-10-CM | POA: Insufficient documentation

## 2022-05-19 LAB — R/O YEAST CULT W/DIRECT EXAM: Stain For Fungus: NONE SEEN

## 2022-05-19 LAB — HSV SEMIQNT RAPID PCR, SWAB/CSF: Rapid HSV 2 PCR Result: NOT DETECTED

## 2022-05-19 LAB — GC&CHLAM NUCLEIC ACID DETECTN

## 2022-05-19 NOTE — Addendum Note (Signed)
Addended by: Alfonse Ras on: 05/19/2022 06:49 AM     Modules accepted: Level of Service

## 2022-05-20 LAB — TRICHOMONAS NUCLEIC ACID: Trichomonas Nucleic Acid Res.: NEGATIVE

## 2022-05-23 LAB — R/O YEAST CULT W/DIRECT EXAM

## 2022-05-25 ENCOUNTER — Emergency Department
Admission: EM | Admit: 2022-05-25 | Discharge: 2022-05-26 | Disposition: A | Discharge status: Home / Self Care | Payer: No Typology Code available for payment source | Attending: Nurse Practitioner | Admitting: Nurse Practitioner

## 2022-05-25 ENCOUNTER — Emergency Department (EMERGENCY_DEPARTMENT_HOSPITAL): Payer: No Typology Code available for payment source

## 2022-05-25 ENCOUNTER — Telehealth (HOSPITAL_BASED_OUTPATIENT_CLINIC_OR_DEPARTMENT_OTHER): Payer: Self-pay | Admitting: Student in an Organized Health Care Education/Training Program

## 2022-05-25 ENCOUNTER — Emergency Department (HOSPITAL_COMMUNITY): Payer: No Typology Code available for payment source

## 2022-05-25 DIAGNOSIS — S93402A Sprain of unspecified ligament of left ankle, initial encounter: Secondary | ICD-10-CM | POA: Insufficient documentation

## 2022-05-25 DIAGNOSIS — Y93E1 Activity, personal bathing and showering: Secondary | ICD-10-CM | POA: Insufficient documentation

## 2022-05-25 DIAGNOSIS — B002 Herpesviral gingivostomatitis and pharyngotonsillitis: Secondary | ICD-10-CM

## 2022-05-25 DIAGNOSIS — M25562 Pain in left knee: Secondary | ICD-10-CM

## 2022-05-25 DIAGNOSIS — M7989 Other specified soft tissue disorders: Secondary | ICD-10-CM

## 2022-05-25 DIAGNOSIS — W010XXA Fall on same level from slipping, tripping and stumbling without subsequent striking against object, initial encounter: Secondary | ICD-10-CM | POA: Insufficient documentation

## 2022-05-25 DIAGNOSIS — W1830XA Fall on same level, unspecified, initial encounter: Secondary | ICD-10-CM

## 2022-05-25 MED ORDER — ACETAMINOPHEN 500 MG OR TABS
1000.0000 mg | ORAL_TABLET | Freq: Once | ORAL | Status: AC
Start: 2022-05-25 — End: 2022-05-25
  Administered 2022-05-25: 1000 mg via ORAL
  Filled 2022-05-25: qty 2

## 2022-05-25 MED ORDER — VALACYCLOVIR HCL 500 MG OR TABS
500.0000 mg | ORAL_TABLET | Freq: Two times a day (BID) | ORAL | 3 refills | Status: AC
Start: 2022-05-25 — End: ?

## 2022-05-25 NOTE — Telephone Encounter (Signed)
Per LV with PCP 05/12/22:  Will start with script for oral breakout. Advised to cont current precautions to prevent passing to new partners (barrier methods, wait for healed lesions) and to let us know if recurrent sx start or new genital lesions. Also if desires pregnancy that this will not preclude her.     - valACYclovir 500 MG tablet; Take 1 tablet (500 mg) by mouth 2 times a day. Take for 3 days for recurrent infection.  Dispense: 6 tablet; Refill: 3    This rx was d/c for med list cleanup 05/18/22. Pended order as above. Confirmed with pt that this is the medication she is requesting, and loaded to [facility].     Pt aware that once [facility] she should receive notification from them. Pt verbalized understanding of the above and has no further requests at this time.

## 2022-05-25 NOTE — Telephone Encounter (Signed)
Pt calling to discuss rx  she is having trouble getting.     Pt requesting call back to discuss medication and care plan. Pt reports rx is not in her chart.     Pt reports rx starts with a 'V' - unable to find rx in chart.

## 2022-05-25 NOTE — ED Triage Notes (Signed)
[facility], sth fallen out of shower, L ankle and knee pain and swellling, unable to bare weight, - LOC, denies hitting head. A & O x 4

## 2022-05-25 NOTE — ED Provider Notes (Signed)
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[place])     NEGATIVE PAST MEDICAL HISTORY OF     PCOS (polycystic ovarian syndrome)     Pre-diabetes     Schizophrenia ([facility])     Vitamin D deficiency        Past Surgical History:   Procedure Laterality Date    LAPAROSCOPY DIAGNOSTIC / BIOPSY / ASPIRATION / LYSIS  07/11/2021    drainage of R sided ovarian cyst    NO PRIOR SURGERIES            MEDICATIONS AND ALLERGIES     OUTPATIENT MEDICATIONS: {Vanishing Tip  Home Meds  MAR Summary :999}    Current Outpatient Medications   Medication Instructions    levonorgestrel (Mirena) 52 mg (20 mcg/day) IUD 1 intrauterine device, Intrauterine, Once    Other Meds (See Sig/Instructions) Medication name: MIRENA    QUEtiapine (SEROQUEL) 200 mg, Oral    QUEtiapine (SEROQUEL) 50 mg, Oral    valACYclovir (VALTREX) 500 mg, Oral, 2 times daily, Take for 3 days for recurrent infection       ALLERGIES: {Vanishing Tip  Update/review allergies :999}  Abilify [aripiprazole]          {Vanishing Tip   Subst & Sexual Hx  Social Doc :999} {Vanishing Tip    Family Hx   :999}   SOCIAL HISTORY AND FAMILY HISTORY   Social History     Tobacco Use    Smoking status: Never   Substance Use Topics    Alcohol use: Yes     Alcohol/week: 2.0 standard drinks of alcohol     Types: 2 Standard drinks or equivalent per week     Comment: Occasional Teqqu    Drug use: Yes     Types: Marijuana, Inhaled     Comment: current smoker 2x a day        Family History       Problem (# of Occurrences) Relation (Name,Age of Onset)    Heart Disease (1) Maternal Grandmother (Debra p)    Diabetes (2) Maternal Grandmother (Debra p): mothers side, Maternal Aunt Velva Harman)    Substance Abuse (2) Maternal Aunt (Nellie l), Maternal Uncle (William p)    Fibromyalgia (1) Mother    Bleeding Tendency (2) Mother, Other (mat [place])                  PHYSICAL EXAM   ED VITALS:  Vitals (Arrival)   {Vanishing Tip  Summary  Graph  Flowsheet  :999}   T: 36.6 C (05/25/22 1714)  BP: 127/79 (05/25/22 1714)  HR: 90 (05/25/22 1714)  RR: 16 (05/25/22 1714)  SpO2: 94 % (05/25/22 1714)     Vitals (Most recent in last 24 hrs)   T: 36.6 C (05/25/22 1714)  BP: 127/79 (05/25/22 1714)  HR: 90 (05/25/22 1714)  RR: 16 (05/25/22 1714)  SpO2: 94 % (05/25/22 1714) Room air  T range: Temp  Min: 36.6 C  Max: 36.6 C  (no weight taken for this visit)     (no height taken for this visit)     There is no height or weight on file to calculate BMI.       Physical Exam          LABORATORY:   Labs Reviewed - No data to display      IMAGING:     ED Wet Read -   XR Knee 4+ View Left   Final Result   No fracture detected.  Alignment is normal.  No focal soft tissue abnormality or knee joint effusion identified.      I have personally reviewed the images and agree with the report (or as edited).      XR Ankle 3+ View Left   Final Result   No fracture detected. Lucency in the lateral talar dome could represent a small chronic osteochondral lesion. Alignment is normal on this nonweightbearing exam.  No soft tissue abnormality.        I have personally reviewed the images and agree with the report (or as edited).          Radiology Final Result -   No image results found.              EKG DOCUMENTATION    {Vanishing Tip  Type .EDEKG to add addtional ED interpretations  :999}  {Was an EKG performed? (Optional):121433}           SUICIDE RISK EVALUATION    {SI - For a patient that screens positive on the CSRSS  (Optional):160000124}         SEPSIS    {Sepsis (Optional):160000130}           ED COURSE/MEDICAL DECISION MAKING   {Vanishing Tip  Insert MDM here  :999}   ***               {External records review (Optional):16000210}    {Assessment requires Independent Historian (Optional):16000211}    {Labs reviewed including (Optional):16000212}    {My interpretation of Radiographic Study (Optional):16000213}    {Social determinants of health that effects today's patient care include (Optional):16000214}    {Patient care and management discussed with (Optional):16000215}    {Was an Trauma/Stroke/STEMI Activation performed? (Optional):160000123}          Medications Given in the ED:   Medications - No data to display           CLINICAL IMPRESSION AND DISPOSITION (Link)     Clinical Impressions:   None        No follow-up provider specified.    Patient was given scripts for the following medications.  New Prescriptions    No medications on file          Disposition: Data Unavailable        CRITICAL CARE/ADDITIONAL INFORMATION REVIEWED ATTENDING ONLY

## 2022-05-26 MED ORDER — OXYCODONE HCL 5 MG OR TABS
5.0000 mg | ORAL_TABLET | ORAL | Status: DC | PRN
Start: 2022-05-26 — End: 2022-05-26
  Filled 2022-05-26: qty 1

## 2022-05-26 MED ORDER — IBUPROFEN 600 MG OR TABS
600.0000 mg | ORAL_TABLET | Freq: Three times a day (TID) | ORAL | 0 refills | Status: AC | PRN
Start: 2022-05-26 — End: ?

## 2022-05-26 MED ORDER — KETOROLAC TROMETHAMINE 15 MG/ML IJ SOLN
15.0000 mg | Freq: Once | INTRAMUSCULAR | Status: AC
Start: 2022-05-26 — End: 2022-05-26
  Administered 2022-05-26: 15 mg via INTRAMUSCULAR
  Filled 2022-05-26: qty 1

## 2022-05-26 MED ORDER — ACETAMINOPHEN 500 MG OR TABS
1000.0000 mg | ORAL_TABLET | Freq: Once | ORAL | Status: DC
Start: 2022-05-26 — End: 2022-05-26

## 2022-05-26 NOTE — Discharge Instructions (Addendum)
The X-rays of your left knee, and left ankle was negative for any fracture or dislocation.    You likely sprained your left ankle/foot. Rest /Ice/elevate your affected leg/foot several times a day to help decrease any swelling.    Take over-the-counter Tylenol or Rx ibuprofen as needed for pain/inflammation.    Follow-up with your primary care physician, who can reevaluate your symptoms, and also consider repeat x-ray/imagings in about 5 to 7 days, outpatient physical therapy referral if your symptoms are worse.

## 2022-05-26 NOTE — Progress Notes (Signed)
ED Social Work Assessment    ID / CC / Reason for Referral:  Pt is a 27yo female presenting to ED s/p fall in shower, resulting in ankle injury. SW referred for discharge transportation coordination.       Identifying data/reason for referral:  Referral Source: Nurse  Referral Reason: Discharge planning    Social Work Summary:  HPI: RN referred pt to SW for discharge transportation coordination.     SW met w/ pt at bedside. Pt reports that she just needs assistance w/ a ride. Pt gives SW her address as [place], [id number]. Pt states that she has her keys and is safe to go home. SW conducts chart review and observes pt to have Medicaid funding for [employer]. SW orders pt a [employer] taxi to return home.     Pt declines any additional SW assistance at this time.     Social History:  Level of independence prior to admission: Independent   Living situation prior to admit:    Support system:         Financial planner Information: Pt did not provide  Contact in Following Order for Legal Next of Kin Decision Making:  Patient as able    Language: English  Interpreter: No     Impression: Pt presented to ED c/o L ankle injury. Pt awake and alert and able to request SW assistance w/ transportation. Pt agreeable to discharge plan.   Plan: Pt to discharge to self-care and return home in [place] taxi.     Brett Fairy, [facility]   Emergency Services Social Worker  [facility]  [phone]  SW Emergency department services (minutes): 20  P

## 2022-06-02 ENCOUNTER — Emergency Department
Admission: EM | Admit: 2022-06-02 | Discharge: 2022-06-02 | Disposition: A | Discharge status: Home / Self Care | Payer: No Typology Code available for payment source | Attending: Nurse Practitioner | Admitting: Nurse Practitioner

## 2022-06-02 ENCOUNTER — Other Ambulatory Visit: Payer: Self-pay

## 2022-06-02 DIAGNOSIS — B349 Viral infection, unspecified: Secondary | ICD-10-CM

## 2022-06-02 DIAGNOSIS — J45909 Unspecified asthma, uncomplicated: Secondary | ICD-10-CM | POA: Insufficient documentation

## 2022-06-02 DIAGNOSIS — K219 Gastro-esophageal reflux disease without esophagitis: Secondary | ICD-10-CM | POA: Insufficient documentation

## 2022-06-02 DIAGNOSIS — E282 Polycystic ovarian syndrome: Secondary | ICD-10-CM | POA: Insufficient documentation

## 2022-06-02 DIAGNOSIS — H6121 Impacted cerumen, right ear: Secondary | ICD-10-CM | POA: Insufficient documentation

## 2022-06-02 DIAGNOSIS — R030 Elevated blood-pressure reading, without diagnosis of hypertension: Secondary | ICD-10-CM | POA: Insufficient documentation

## 2022-06-02 DIAGNOSIS — F209 Schizophrenia, unspecified: Secondary | ICD-10-CM | POA: Insufficient documentation

## 2022-06-02 DIAGNOSIS — U071 COVID-19: Secondary | ICD-10-CM | POA: Insufficient documentation

## 2022-06-02 LAB — RAPID FLU A & B,  RSV, SARS-COV-2 PCR
COVID-19 Coronavirus Qual PCR Result: DETECTED — AB
Rapid Influenza A PCR Result: NOT DETECTED
Rapid Influenza B PCR Result: NOT DETECTED
Rapid RSV PCR Result: NOT DETECTED

## 2022-06-02 NOTE — ED Provider Notes (Addendum)
CHIEF COMPLAINT   Chief Complaint   Patient presents with    Flu Symptoms            HISTORY OF PRESENT ILLNESS AND REVIEW OF SYSTEMS        27 y/o female with PMH of asthma, PCOS, schizophrenia, and GERD who developed viral symptoms yesterday: runny nose, sore throat, cough, fatigue/malaise, sweats/chills. This morning, she developed diarrhea and vomited twice. She works at [employer] and tells me several of her co-workers have been sick. One tested positive for COVID. Has back pain at baseline; denies other body aches. Denies chest pain or SOB. She notices her asthma symptoms a few times a week and uses an albuterol inhaler.          PAST MEDICAL AND SURGICAL HISTORY   Past Medical History:   Diagnosis Date    Allergic rhinitis due to allergen     Anxiety     Asthma     Depression     Disorder of menstrual bleeding     GERD (gastroesophageal reflux disease)     Headache     Heart burn     Irregular menses     Lipidemia     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HCC)     NEGATIVE PAST MEDICAL HISTORY OF     PCOS (polycystic ovarian syndrome)     Pre-diabetes     Schizophrenia (HCC)     Vitamin D deficiency      Past Surgical History:   Procedure Laterality Date    LAPAROSCOPY DIAGNOSTIC / BIOPSY / ASPIRATION / LYSIS  07/11/2021    drainage of R sided ovarian cyst    NO PRIOR SURGERIES            MEDICATIONS AND ALLERGIES     OUTPATIENT MEDICATIONS:   Current Outpatient Medications   Medication Instructions    ibuprofen (MOTRIN) 600 mg, Oral, Every 8 hours PRN    levonorgestrel (Mirena) 52 mg (20 mcg/day) IUD 1 intrauterine device, Intrauterine, Once    Other Meds (See Sig/Instructions) Medication name: MIRENA    QUEtiapine (SEROQUEL) 200 mg, Oral    QUEtiapine (SEROQUEL) 50 mg, Oral    valACYclovir (VALTREX) 500 mg, Oral, 2 times daily, Take for 3 days for recurrent infection     Using albuterol inhaler.   Is supposed to be on Seroquel daily but just takes it PRN for sleep.   Mirena IUD.     ALLERGIES:   Abilify  [aripiprazole]              SOCIAL HISTORY AND FAMILY HISTORY   Social History     Tobacco Use    Smoking status: Never   Substance Use Topics    Alcohol use: Yes     Alcohol/week: 2.0 standard drinks of alcohol     Types: 2 Standard drinks or equivalent per week     Comment: Occasional Teqqu    Drug use: Yes     Types: Marijuana, Inhaled     Comment: current smoker 2x a day     Denies tobacco use. Drinks alcohol rarely. Smokes about an 8th of cannabis/day, has been taking a break while sick.     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 Has a PCP in the [facility] system and enrolled in MyChart.     Denies concern for pregnancy and declines testing.     Family History       Problem (# of Occurrences) Relation (Name,Age of Onset)    Heart  Disease (1) Maternal Grandmother (Debra p)    Diabetes (2) Maternal Grandmother (Debra p): mothers side, Maternal Aunt Velva Harman)    Substance Abuse (2) Maternal Aunt (Nellie l), Maternal Uncle (William p)    Fibromyalgia (1) Mother    Bleeding Tendency (2) Mother, Other (mat [place])                  PHYSICAL EXAM   ED VITALS:  Vitals (Arrival)      T: (!) 35.2 C (06/02/22 0732)  BP: (!) 147/91 (06/02/22 0732)  HR: 90 (06/02/22 0732)  RR: 18 (06/02/22 0732)  SpO2: 97 % (06/02/22 0732)     Vitals (Most recent in last 24 hrs)   T: (!) 35.2 C (06/02/22 0732)  BP: (!) 147/91 (06/02/22 0732)  HR: 90 (06/02/22 0732)  RR: 18 (06/02/22 0732)  SpO2: 97 % (06/02/22 0732) Room air  T range: Temp  Min: 35.2 C  Max: 35.2 C  (no weight taken for this visit)     (no height taken for this visit)     There is no height or weight on file to calculate BMI.       Physical Exam  Constitutional:       Appearance: Normal appearance.   HENT:      Head: Normocephalic and atraumatic.      Right Ear: There is impacted cerumen.      Left Ear: Tympanic membrane, ear canal and external ear normal.      Nose: Rhinorrhea present.      Mouth/Throat:      Mouth: Mucous membranes are moist.      Pharynx: Oropharynx is clear. No  oropharyngeal exudate.      Tonsils: No tonsillar exudate. 1+ on the right. 1+ on the left.      Comments: Mild pharyngeal erythema.   Eyes:      General:         Right eye: No discharge.         Left eye: No discharge.   Neck:      Musculoskeletal: Normal range of motion and neck supple.   Cardiovascular:      Rate and Rhythm: Normal rate and regular rhythm.   Pulmonary:      Effort: Pulmonary effort is normal. No respiratory distress.      Breath sounds: Normal breath sounds. No wheezing or rhonchi.   Abdominal:      General: There is no distension.      Palpations: Abdomen is soft.      Tenderness: There is no abdominal tenderness. There is no guarding.   Musculoskeletal:      Cervical back: Normal range of motion and neck supple.   Lymphadenopathy:      Cervical: No cervical adenopathy.   Skin:     General: Skin is warm and dry.   Neurological:      General: No focal deficit present.      Mental Status: She is alert and oriented to person, place, and time.   Psychiatric:         Mood and Affect: Mood normal.         Behavior: Behavior normal.           LABORATORY:   Labs Reviewed   RAPID FLU A & B,  RSV, SARS-COV-2 PCR - Abnormal       Result Value    Rapid Flu A,B,RSV,SARS-CoV-2 PCR Spec. Desc. Nasal swab      Rapid  Influenza A PCR Result None detected      Rapid Influenza B PCR Result None detected      Rapid RSV PCR Result None detected      COVID-19 Coronavirus Qual PCR Result Detected (*)     COVID-19 Coronavirus Qual PCR Interpretation        Value: This is a positive result, indicating the presence of the virus that causes COVID-19.    COVID-19 Qualitative PCR Indication Symptomatic           IMAGING:     ED Wet Read -   No orders to display       Radiology Final Result -   No image results found.              ED COURSE/MEDICAL DECISION MAKING        27 y/o female with mild-moderate viral syndrome x2 days. On exam, alert, well-appearing, VSS (moderate HTN), afebrile. DDx includes: COVID, influenza, common  cold, gastroenteritis. No tonsillar exudates or anterior cervical LAD to suggest strep. Has IUD and declines pregnancy testing. Patient has PCP and MyChart access, so recommend she go home and check results.     ED Course as of 06/02/22 0900   Thu Jun 02, 2022   0900 COVID+. Patient called & notified of results. Supportive care includes rest, hydration, Tylenol/NSAIDs. Isolate x5 days, then mask x5 days. ED return precautions: chest pain, SOB, high fever, uncontrolled vomiting, getting worse not better.  [EM]      ED Course User Index  [EM] Ilene Witcher, Festus Holts, [place] Student        Medications Given in the ED:   Medications - No data to display    I, Gwendolyn Grant, ARNP, saw and evaluated the patient. I reviewed the ARNP students's note and agree with findings and plan as documented.           CLINICAL IMPRESSION AND DISPOSITION (Link)     Clinical Impressions:   [B34.9] Viral syndrome   [U07.1] [place], [place], [place] [id number]  [place] [address]  [phone]            Patient was given scripts for the following medications.  Discharge Medication List as of 06/02/2022  8:07 AM             Disposition: Discharge        CRITICAL CARE/ADDITIONAL INFORMATION REVIEWED ATTENDING ONLY                 [place], Eleonore Chiquito, [place]  06/02/22 [place], Eleonore Chiquito, [place]  06/14/22 1034

## 2022-06-02 NOTE — ED Triage Notes (Signed)
Ambulatory to front triage with c/o "I'm having flu/COVID like symptoms.  Cough and sore throat x2 days.  N/V/D this am."

## 2022-06-02 NOTE — Discharge Instructions (Addendum)
Today we believe you have a viral syndrome, possibly influenza or covid.  Please use ibuprofen and tylenol, keep hydrated and rest.  Please stay home until you are feeling better.       Please check mychart for you results!

## 2022-06-02 NOTE — Result Encounter Note (Signed)
This patient was notified of positive COVID-19 test result during [facility] ED visit on 06/02/22 and provided with education related to this diagnosis. ED resource RN follow-up related to this result is discontinued.

## 2022-06-03 ENCOUNTER — Telehealth (HOSPITAL_BASED_OUTPATIENT_CLINIC_OR_DEPARTMENT_OTHER): Payer: Self-pay | Admitting: Student in an Organized Health Care Education/Training Program
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
## 2022-06-06 ENCOUNTER — Ambulatory Visit (HOSPITAL_BASED_OUTPATIENT_CLINIC_OR_DEPARTMENT_OTHER): Payer: No Typology Code available for payment source | Admitting: Psychiatry

## 2022-06-17 ENCOUNTER — Telehealth (HOSPITAL_BASED_OUTPATIENT_CLINIC_OR_DEPARTMENT_OTHER): Payer: Self-pay | Admitting: Clinical Social Worker

## 2022-06-17 DIAGNOSIS — F411 Generalized anxiety disorder: Secondary | ICD-10-CM

## 2022-06-17 NOTE — Telephone Encounter (Signed)
Tw called Ashlee Long to check in. Re scheduled intake with Dr. Fleeta Emmer. Scheduled a visit with tw. Ashlee Long is no longer working therefore can continue working with tw.    Beryle Beams, [place] Practitioner  [facility] and Addiction Services   [phone]

## 2022-06-28 ENCOUNTER — Ambulatory Visit: Payer: No Typology Code available for payment source | Admitting: Clinical Social Worker

## 2022-06-28 DIAGNOSIS — F411 Generalized anxiety disorder: Secondary | ICD-10-CM

## 2022-06-28 NOTE — Progress Notes (Signed)
Distant Site Telemedicine Encounter  I conducted this encounter via secure, live, face-to-face video conference with the patient. I reviewed the risks and benefits of telemedicine as pertinent to this visit and the patient agreed to proceed.    Provider Location: On-site location (clinic, hospital, on-site office)  Patient Location: At home  Present with patient: Family member        [place] [facility] [place]  Progress Note    Date: 06/28/22    Duration (Mins): 30 minutes      Diagnosis: (F41.1) Generalized anxiety disorder  (primary encounter diagnosis)      Presentation: Ashlee Long presents on time for this appointment. We spent today's session working on a recovery plan. We will finish filling it out during our next meeting.      MENTAL STATUS EXAMINATION:  (all drop box)  General appearance:  N/A  Behavior/activity: engaged  Speech: generally normal rate and prosody  Affect: euthymic  Mood: euthymic  Thought Form: linear and goal directed  Thought Content: No SI/HI and goal directed   Orientation: person, place, time, and situation  Attention/concentration: attentive  Memory: intact  Insight and judgment: good  Additional comments/details:     Assessment: Tw only spoke with Ashlee Long before ending the call.     Intervention: unconditional positive regard, reflective listening    Goals Addressed: Psychiatric Symptomatology      Plan/Follow-Up: Meet next week    Clinician:     Beryle Beams, [place] and Addiction Services   [phone]     Recovery Plan - Individual Service Plan           Summary of goal attainment or significant events since intake or last plan:      Psychiatric Diagnosis and Symptoms    Client symptoms meet the diagnostic criteria for:     Client's description of symptoms past and current: depression, mood swings, impulsive, heart racing when experiencing anxiety, anger, low energy, hypersomnia and crying during times  of depression, HX of SI. Sometimes will sleep to deal with overwhelm. Sleep depends on mood. If she has something important to do the next day she won't sleep due to anxiety.       Current symptoms are: depression, mood swings, impulsive, heart racing when experiencing anxiety, anger, low energy, hypersomnia and crying during times of depression. Sometimes will sleep to deal with overwhelm. Sleep depends on mood. If she has something important to do the next day she won't sleep due to anxiety. Sometimes wishes she could go to sleep and not wake up. "Learning the power of controlling your emotions." Is thinking about how she responds and. Scared of when I don't do that. Alcohol affects this. Is not able to think that far when she's been drinking. Can control anger to an extent. Will react so intensely and its scary.    Goal: working to control herself anf her anger. Would like to get better at not over thinking. This triggers the risk of her emotions because now I'm angry.. I start spiraling. Over thinking is the red carpet to my spiraling."    Plan- Prevention/Intervention Strategies    Client to: meet with prescriber, take medication as prescribed, report symptoms, inform staff of interventions that have been helpful in the past, meet with case manager as scheduled, and attend scheduled group therapy    Case Manager to: schedule regular appointments with provider     Substance Use    Current  Use:  Alcohol:  At one point was not drinking. "Maybe like three or four shots on the weekends Friday and Saturday  Marijuana and other drugs: "I think I smoke more weed than alcohol." Its relaxed on weed, mellow, doesn't have the energy to be mad angry." Sativa hybrid, smokes three times a day. Once in the morning and at night and in between there. Smokes blunts.   Tobacco: just uses the outside for making blunts    Past history or treatment: Never been in treatment. "I will do detoxes of 45 days before returning to use." Has  never needed to go to treatment.    Goal: "eventually I would like to stop smoking. By the time I finish college because I know I wont be able to smoke weed like that for the job I want."    Intervention/ Preventions Strategies:   Client to: monitor use     Medical Coordination/Integration    Medical needs currently identified by the patient and/or providers:      Primary Care Provider most recently seen by the patient:      Patient's Medical Insurance:         Client: Is independent in managing medical needs     Goal: "to lose weight, that's it. I want to lose at least a hundred lbs." Medication for weight loss was making her sleepa lot and had really low sugar."    Plan:   Client to: report symptoms    Psycho Social Domains    Housing    Current Housing situation:      Goal:      Plan:   None needed       Food/ Dietary    Client is able to obtain and prepare food independently: Yes  Needs assistance with:  Does not need assistance    Goal:      Plan:   None needed       Employment/Activity    Client is currently      Client is satisfied with current activity level: No    Goal:      Plan:   None needed       Family and Support System    Client reports the following support system:      Goal:      Client to        Income / [place] and Amount:      Money Management: no needs identified    Goal:      Plan:   None needed       Legal History/ Concerns   None    Goal:      Plan:  None needed       Client Identified Strength to achieve recovery goals:        Age related, cultural, spiritual, disability needs and goals:         Advance Directives were discussed with client and with the following response and plan:

## 2022-06-29 ENCOUNTER — Ambulatory Visit (HOSPITAL_BASED_OUTPATIENT_CLINIC_OR_DEPARTMENT_OTHER): Payer: No Typology Code available for payment source | Admitting: Dermatology

## 2022-07-05 ENCOUNTER — Ambulatory Visit: Payer: No Typology Code available for payment source | Admitting: Clinical Social Worker

## 2022-07-05 DIAGNOSIS — F411 Generalized anxiety disorder: Secondary | ICD-10-CM

## 2022-07-05 NOTE — Progress Notes (Signed)
Ashlee Long did not cancel and was not present for a scheduled appointment today.  Disposition: Chart reviewed, patient contacted by phone regarding follow-up    Tw called [name] and LVM at 10:03 regarding fu.    Beryle Beams, [place] Practitioner  [facility] and Addiction Services   [phone]

## 2022-07-08 NOTE — ED Notes (Signed)
9:42 PM PST  ED Discharge Note  Verbal and written discharge instructions reviewed with patient.  Patient instructed to return to ED for worsening symptoms and to follow up with their Primary Care Provider.  patient verbalized understanding.   Vital signs stable on room air. Patient discharged to Home in Stable condition.   Patient in no apparent distress, left with all belongings, and reports having no further questions regarding discharge.     07/08/2022

## 2022-07-12 ENCOUNTER — Ambulatory Visit (HOSPITAL_BASED_OUTPATIENT_CLINIC_OR_DEPARTMENT_OTHER)
Payer: No Typology Code available for payment source | Admitting: Student in an Organized Health Care Education/Training Program
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
## 2022-07-18 ENCOUNTER — Ambulatory Visit: Payer: No Typology Code available for payment source | Admitting: Psychiatry

## 2022-07-18 DIAGNOSIS — F410 Panic disorder [episodic paroxysmal anxiety] without agoraphobia: Secondary | ICD-10-CM

## 2022-07-18 DIAGNOSIS — F4310 Post-traumatic stress disorder, unspecified: Secondary | ICD-10-CM

## 2022-07-18 DIAGNOSIS — R440 Auditory hallucinations: Secondary | ICD-10-CM

## 2022-07-18 MED ORDER — FLUOXETINE HCL 20 MG OR CAPS
20.0000 mg | ORAL_CAPSULE | Freq: Every day | ORAL | 2 refills | Status: DC
Start: 2022-07-18 — End: 2022-08-15

## 2022-07-18 MED ORDER — QUETIAPINE FUMARATE 200 MG OR TABS
200.0000 mg | ORAL_TABLET | Freq: Every evening | ORAL | 2 refills | Status: DC
Start: 2022-07-18 — End: 2022-12-19

## 2022-07-18 MED ORDER — QUETIAPINE FUMARATE 25 MG OR TABS
25.0000 mg | ORAL_TABLET | Freq: Every day | ORAL | 2 refills | Status: DC | PRN
Start: 2022-07-18 — End: 2022-12-19

## 2022-07-18 NOTE — Patient Instructions (Addendum)
It was nice to see you today. Here's what we discussed:     Please reach out to the DBT program: [email or web address]. Sometimes there is an additional options when you are already a patient at [facility] reach out to [facility] to see what that is.      2. Talk to [facility] about whether it would be helpful to work with a Substance use counselor. I think this could be helpful - it might be hard to do this AND the DBT program but if you get on the waitlist for DBT then maybe it's a good time to do some work with a substance use counselor first.     3. Please keep taking quetiapine '200mg'$  at nighttime. You can also take '25mg'$ -'50mg'$  daily as needed for voices or hallucinations.     4. Please start Fluoxetine '20mg'$  daily I hope this will help with anxiety and reduce the frequency of panic attacks.     5. We'll meet next on 4/8 3:30PM    6. If you need to change your appointment time please call the front desk at [phone]    Take care,   Cathleen Fears, MD    ---------------------------------------------------------------------------------------------------------------------------------------------------------------------------------------------------------  Thank you for coming to clinic today. If you have a question and can wait 24-48 hours for a response, you may message me via your MyChart account. I  generally respond to these messages within 1-2 business days.    Please note that during today's visit we reviewed only your psychiatric (behavioral health) medications. Please follow-up with your primary care provider with any questions or concerns regarding other (non-psychiatric) medications.    Self-harm Prevention Safety Plan:  If your psychiatric symptoms worsen to the degree that you feel like you need to speak with a provider more urgently, please call [phone] and ask to speak to the Triage nurse or case manager. They can also help relay messages to your psychiatrist or another  psychiatry provider in the clinic.   If you would like to speak with a peer for extra support please call the [facility] Warm line: 1-877-500-WARM  If your symptoms worsen so much that you're thinking about hurting yourself or have thoughts about suicide please do one of the following:  - Call Suicide Prevention Lifeline (416) 542-6382  - Call the Crisis Connection 24-hour line: 201-727-2072 (1-866-4CRISIS)  - Call 911   - Go to the emergency room

## 2022-07-19 ENCOUNTER — Telehealth (INDEPENDENT_AMBULATORY_CARE_PROVIDER_SITE_OTHER): Payer: No Typology Code available for payment source | Admitting: Family Medicine

## 2022-07-19 NOTE — Progress Notes (Signed)
Patient did not show up to zoom meeting.     Waited until 1128am.

## 2022-07-19 NOTE — Progress Notes (Deleted)
[facility] MEDICINE CENTER FOR WEIGHT LOSS AND METABOLIC SURGERY PHYSICIAN FOLLOW UP NOTE  07/19/2022       ASSESSMENT/PLAN:  Ashlee Long has class 3 Obesity (Body mass index is 41.14 kg/m. )and her comorbidities include:    No diagnosis found.      Percent of body weight loss required for therapeutic benefit:  Diabetes prevention: 10% (80% reduction in risk)  T2DM: 5-15% or more (reduce A1c, reduce number and/or doses of glucose lowering medications)  Dyslipidemia: 5-15% or more (lower TG, higher HDL-c, lower non-HDL-c)  Hypertension: 5-15% or more (lower SBP and DBP, reduction in number and/or doses of anti-hypertensive medications)  Non-alcoholic fatty liver disease (NAFLD/steatosis): 5% or more (reduction in intrahepatocellular lipid)  Non-alcoholic steatohepatitis (NASH) 10-40% (reduction in inflammation and fibrosis)  Osteoarthritis: 10% or more (increased function, improvement in symptoms)  Asthma/reactive airway disease 7-8% or more (improvement in forced expiratory volume at 1 second, improved symptoms)  Obstructive sleep apnea: 7-11% or more (decreased apnea-hypopnea index)  Urinary stress incontinence: 5-10% or more (reduced frequency of incontinence episodes)   Gastroesophageal reflux disease (GERD): 10% or more (reduced symptom frequency and severity)  Polycystic ovarian syndrome (PCOS): 5-15% or more (ovulation, regulate menses, reduce hirsutism, enhance insulin sensitivity, reduce serum androgen levels)  Female Hypogonadism: 5-10% or more (increase in serum testosterone)  Depression: Uncertain % (reduce depression symptomatology, improvement in depression scores)    Hessie Diener, et al, 2016 AACE/ACE Comprehensive Clinical Practice Guidelines for Medical Care of Patient's with Obesity)    Assessment/Progress toward goals:    Visit Date Weight (lb) Waist (in)  Hip (in)   Initial                                                                               Since our last visit, Ashlee Long has lost *** pounds. She  has lost *** lb (***% of her body weight) in our program since ***.      WE AGREED TO THE FOLLOWING PLAN:  Ongoing enrollment in 1 year intensive weight management program:   Start date: ***  End/Graduation date: ***  Nutrition:  ***    Exercise goal-3-5 hours per week of moderate to vigorous cardiovascular activity per week and resistance exercise 2 days per week  Today we discussed you would: ***    Behavior:   Motivational interviewing performed   Practice good sleep hygiene, wear cpap  Track food, exercise and weight at least once a week  ***    Medications:    Continue ***  Change to: ***    Surgery: Based on the presence of Stage 2 Obesity, Bariatric surgery could be considered with a BMI of 35 or more Hessie Diener, et al, 2016 AACE/ACE Comprehensive Clinical Practice Guidelines for Medical Care of Patient's with Obesity)    Laboratory tests: ***    Other :  ***    Body composition due: ***  Follow up: ***    Please send ecare/Mychart message if any concerns regarding the above plan.    Please note, upon graduation from this 1-year medically supervised weight loss program, follow up visits will step down to 6-12 months and then yearly thereafter to provide for access for new patients  to be enrolled.  We will be available if needed but we are a very limited service and are unable to have ongoing intensive follow up after a year.   Thank you for your understanding.       REVIEWED:   '[]'$ Treatment plan    '[]'$  Medication side effects and importance of not discontinuing medications like Qsymia/topiramate/zonisamide suddenly due to risk of seizure   '[]'$  Pregnancy/ breastfeeding risks and need for birth control if on obesity treatments   '[]'$  Nutrition and the importance of regular protein intake, portion-control, eating slowly and good hydration in active weight loss   '[]'$  Need for vitamin Supplementation (changes noted are documented above)   '[]'$  Importance of self-monitoring blood pressure on current obesity treatment and/or  during active weight loss on hypertensive treatment     '[]'$  Importance of vigilant blood sugar monitoring during active weight loss   '[]'$  Reviewed that use of phentermine for more than 90 days is considered "off-label"   '[]'$  Reviewed that phentermine/ qsymia/ diethylpropion are "controlled medications" and by law, a controlled medication can only be prescribed from one facility at a time.  Patient agrees that only [facility]  will prescribe her  anti-obesity medications. Patient was also advised to keep controlled medications in a safe secure place away from children.    '[]'$  Reviewed again that current treatment combination is not FDA approved for obesity treatment but it should have similar efficacy to *** which is approved by the FDA for treatment of obesity.  Patient understands that this non-FDA approved treatment was chosen by a Certified Obesity Medicine Specialist with expertise in Obesity treatments.       FURTHER RECOMMENDATIONS:   Obesity/overweight can increase risk of certain cancers and is recommended to keep up-to-date with cancer screening.      Thank you for referring your patient to the [facility]. It was a pleasure to be involved in your patient's care.   Dr. Roberts Gaudy MD  Diplomate of the American Board of Obesity Medicine   Co-Director for [facility] for Weight [facility] and Metabolic Surgery at [place], [place]   [address]  [phone]    See also After Visit Summary/ ecare message sent today for the detailed instructions that were given to patient.  -----------------------------------------------------------------------------------------------------------------------------------------------------------  TIME:   10:02 AM - ***    I have spent *** minutes on this visit today including chart review prior to the visit, face to face time with the patient, and documentation and coordination of care after the  visit.    Distant Site Telemedicine Encounter  I conducted this encounter via secure, live, face-to-face video conference with the patient. I reviewed the risks and benefits of telemedicine as pertinent to this visit and the patient agreed to proceed.    Provider Location: {Required for Compliance:500210071::"On-site location (clinic, hospital, on-site office)"}  Patient Location: {Required for Compliance (SDE):123536::"At home"}  Present with patient: {Required for Compliance:124298::"No one else present"}         Interpretor: ***    CHIEF COMPLAINT:  Ashlee Long is a 27 year old female  who presents for follow-up regarding weight management for obesity and associated co-morbidities.     The following portions of the patient's history were reviewed with the patient and updated as appropriate: problem list, current medications, allergies, past medical history, past surgical history, past social history and past family history.  HPI:  Yaminah is following up for medically supervised weight loss.     Patient was last seen ***      CO-MORBIDITIES: updated 07/19/22  ***     RECENT WEIGHTS:  Wt Readings from Last 5 Encounters:   07/19/22 (!) 133.8 kg (295 lb)   05/18/22 (!) 133.7 kg (294 lb 11.2 oz)   03/21/22 (!) 135.4 kg (298 lb 6.4 oz)   02/22/22 (!) 137.9 kg (304 lb)   02/17/22 (!) 138.3 kg (304 lb 12.8 oz)          INTERVAL HISTORY:  Since our last visit, Ronald has been doing *** and *** struggling with appetite control or cravings.     No questionnaires on file.      CURRENT DIETARY HABITS:  Tracking: ***  Dietician: ***  Goals: ***  Vitamin Supplementation: ***  Protein: ***  Hydration: ***  Alcohol: ***  Barriers: ***    CURRENT PHYSICAL ACTIVITY:  Her exercise regime consists of daily activities of daily living and:  ***  Steps per day: ***  Barriers:***  Physical therapy: ***    SLEEP HYGIENE:  Currently sleeps ***  Barriers: ***    CURRENT ANTI-OBESITY TREATMENT:   ***  Appetite well controlled:   '[]'$   yes  '[]'$   No  ***  Side effects: ***        If applicable:   Sexually active: ***  Birth Control: ***         BEHAVIOR:    Self monitoring weight: ***  Social work: ***  Mood: ***       REVIEW OF SYSTEMS:  Positive for ***.  All other systems reviewed with the patient and otherwise negative except as outlined above and noted in the HPI    PHYSICAL EXAMINATION:   Ht '5\' 11"'$  (1.803 m)   Wt (!) 133.8 kg (295 lb)   BMI 41.14 kg/m        Wt Readings from Last 5 Encounters:   07/19/22 (!) 133.8 kg (295 lb)   05/18/22 (!) 133.7 kg (294 lb 11.2 oz)   03/21/22 (!) 135.4 kg (298 lb 6.4 oz)   02/22/22 (!) 137.9 kg (304 lb)   02/17/22 (!) 138.3 kg (304 lb 12.8 oz)     BP Readings from Last 5 Encounters:   05/25/22 118/85   05/18/22 109/78   03/21/22 117/76   02/17/22 123/83   01/19/22 (!) 69/55       Physical Exam

## 2022-07-22 ENCOUNTER — Telehealth (HOSPITAL_BASED_OUTPATIENT_CLINIC_OR_DEPARTMENT_OTHER): Payer: Self-pay | Admitting: Student in an Organized Health Care Education/Training Program

## 2022-07-22 NOTE — Telephone Encounter (Signed)
From PCP:    Received letter that derm clinic now full so currently denying referral. I see that she no showed her appt that was previously scheduled with Dr. Inis Sizer. Can we reach out to pt to see if she would still like to be seen? If so recommending calling insurance to see if other providers in the [place] area are available to her and we can place a referral.     Message to pt regarding the above.

## 2022-07-29 ENCOUNTER — Ambulatory Visit: Payer: No Typology Code available for payment source | Attending: Internal Medicine | Admitting: Internal Medicine

## 2022-07-29 ENCOUNTER — Other Ambulatory Visit (HOSPITAL_BASED_OUTPATIENT_CLINIC_OR_DEPARTMENT_OTHER): Payer: Self-pay

## 2022-07-29 VITALS — BP 115/79 | HR 93 | Temp 97.3°F | Wt 300.5 lb

## 2022-07-29 DIAGNOSIS — Z113 Encounter for screening for infections with a predominantly sexual mode of transmission: Secondary | ICD-10-CM | POA: Insufficient documentation

## 2022-07-29 DIAGNOSIS — Z30432 Encounter for removal of intrauterine contraceptive device: Secondary | ICD-10-CM | POA: Insufficient documentation

## 2022-07-29 DIAGNOSIS — Z30015 Encounter for initial prescription of vaginal ring hormonal contraceptive: Secondary | ICD-10-CM | POA: Insufficient documentation

## 2022-07-29 MED ORDER — ETONOGESTREL-ETHINYL ESTRADIOL 0.12-0.015 MG/24HR VA RING
1.0000 | VAGINAL_RING | VAGINAL | 0 refills | Status: DC
Start: 2022-07-29 — End: 2022-12-12
  Filled 2022-07-29: qty 3, 84d supply, fill #0

## 2022-07-29 NOTE — Progress Notes (Signed)
[facility] - Internal Medicine Clinic Note      ID/CC:  27 year old woman presents requesting IUD removal    HPI:    Ashlee Long is a 26yo woman with history of PCOS, abnormal uterine bleeding, right ovarian cyst, history of chlamydia infection, GAD/depression who presents here today to discuss:    # IUD removal  Ashlee Long had a Mirena IUD placed by GYN in May 2023 for menorrhagia with irregular cycles which was thought to be secondary to PCOS. Procedure done with no complications. Patient states that before IUD she had tried Depo-provera (had significant weight gain), Nexplanon, Progestin-only pill. She was doing well with the Mirena IUD until about Dec 2023 when she started presenting with intermittent cramping and dark brown spotting. She also reported having frequent BV and yeast infections after IUD was placed. She was seen at GYN clinic in Jan 2024 due to vaginal symptoms and consideration of IUD removal given the symptoms above. During that visit, a limited pelvic ultrasound was done and showed IUD in correct place. STD testing was negative, BV and yeast also negative. Decision at the time was to maintain IUD in place.    Patient returns today reporting persistent pelvic pain since intercourse about 6 days ago and has decided she would like the IUD removed. She reports mild white vaginal discharge with no pruritus or odor.     She has no plans for getting pregnant now. Currently sexually active with women. Understands that after removing the IUD she will become immediately fertile and is interested in using Nuvaring. She has done some research about it and thinks it could work well for her as it a contraceptive method she can just remove herself/stop using it if she thinks it is causing side effects/not working for her. Patient denies a history of blood clots, breast cancer, hypertension, migraine with aura. Uses tobacco occasionally when making MJ cigarettes.    ROS:  A complete review of  systems was performed and is negative, except per the ones mentioned in the HPI     Physical exam:   Vitals:    07/29/22 1438   BP: 115/79   Pulse: 93   Temp: 36.3 C   SpO2: 98%     Pelvic exam: exam chaperoned by Dr. Windle Guard, normal vagina and vulva, EGBUS within normal limits, unremarkable vaginal discharge, normal appearance of the cervix, IUD strings visible. IUD removed with no complications. Mirena IUD device came out intact and was shown to patient and her partner who was in the room. DNA probe for GC/chlamydia and trichomonas obtained. Consent was signed by patient prior to procedure and after discussion of possible complications such as pain, cramping, bleeding.    Assessment & Plans:  Ashlee Long was seen today for the issues below:    (E70.350) Encounter for IUD removal  (primary encounter diagnosis)  Discussed options to perform STD screening and pelvic US to evaluate if IUD was the cause for new pain. She declined and preferred to have IUD removed today. IUD Removal went without complications. Patient had moderate cramping after the procedure which improved by the time she left the room.    (Z30.015) Encounter for initial prescription of vaginal ring hormonal contraceptive  No plans for conception in the near future. No history of blood clots, breast cancer, hypertension, migraine with aura. Smokes tobacco  when mixes it in MJ cigarettes.  Plan: etonogestrel-ethinyl estradiol (NuvaRing) 0.12-0.015 MG/24HR vaginal ring  Counseled to return for follow up evaluation if side-effects    (Z11.3) Screen for STD (sexually transmitted disease)  Plan: GC&CHLAM NUCLEIC ACID DETECTN, Trichomonas Nucleic Acid Detection    Return for follow up with PCP    Arvil Persons Glennon Hamilton, MD  Clinical Assistant Professor  [facility]

## 2022-07-30 LAB — GC&CHLAM NUCLEIC ACID DETECTN
Chlam Trachomatis Nucleic Acid: NEGATIVE
N.Gonorrhoeae(GC) Nucleic Acid: NEGATIVE

## 2022-08-01 LAB — TRICHOMONAS NUCLEIC ACID: Trichomonas Nucleic Acid Res.: NEGATIVE

## 2022-08-03 ENCOUNTER — Other Ambulatory Visit (HOSPITAL_BASED_OUTPATIENT_CLINIC_OR_DEPARTMENT_OTHER): Payer: Self-pay

## 2022-08-05 ENCOUNTER — Telehealth (INDEPENDENT_AMBULATORY_CARE_PROVIDER_SITE_OTHER): Payer: No Typology Code available for payment source | Admitting: Family Medicine
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
## 2022-08-15 ENCOUNTER — Ambulatory Visit: Payer: No Typology Code available for payment source | Admitting: Psychiatry

## 2022-08-15 DIAGNOSIS — F410 Panic disorder [episodic paroxysmal anxiety] without agoraphobia: Secondary | ICD-10-CM

## 2022-08-15 DIAGNOSIS — F4310 Post-traumatic stress disorder, unspecified: Secondary | ICD-10-CM

## 2022-08-15 MED ORDER — FLUOXETINE HCL 40 MG OR CAPS
40.0000 mg | ORAL_CAPSULE | Freq: Every day | ORAL | 2 refills | Status: DC
Start: 2022-08-15 — End: 2022-12-19

## 2022-08-15 NOTE — Patient Instructions (Addendum)
It was nice to see you today. Here's what we discussed:     1. Please fax [place] your outside records including past medication trials. Our fax number is: [phone].     2. Please call the front desk at [phone] to schedule a follow up appointment to talk about support around housing and looking for a job.     3. Please use quetiapine 12.5-50mg  as needed for anger/irritability    4. Please increase the fluoxetine to  daily.     5. Please keep thinking about if therapy would be helpful: I think DBT could be helpful at treating angry outbursts and substance-use therapy could be helpful in supporting your goals around alcohol use.     6. Let's meet again on 5/6 at 230PM on zoom       Take care,   Jodi Mourning, MD    ---------------------------------------------------------------------------------------------------------------------------------------------------------------------------------------------------------  Thank you for coming to clinic today. If you have a question and can wait 24-48 hours for a response, you may message me via your MyChart account. I  generally respond to these messages within 1-2 business days.    Please note that during today's visit we reviewed only your psychiatric (behavioral health) medications. Please follow-up with your primary care provider with any questions or concerns regarding other (non-psychiatric) medications.    Self-harm Prevention Safety Plan:  If your psychiatric symptoms worsen to the degree that you feel like you need to speak with a provider more urgently, please call [phone] and ask to speak to the Triage nurse or case manager. They can also help relay messages to your psychiatrist or another psychiatry provider in the clinic.   If you would like to speak with a peer for extra support please call the [place] Warm line: 1-877-500-WARM  If your symptoms worsen so much that you're thinking about hurting yourself or have thoughts about suicide please do one of the  following:  - Call Suicide Prevention Lifeline 312-586-0442  - Call the Crisis Connection 24-hour line: 912-436-9544 (1-866-4CRISIS)  - Call 911   - Go to the emergency room

## 2022-08-17 ENCOUNTER — Ambulatory Visit (HOSPITAL_BASED_OUTPATIENT_CLINIC_OR_DEPARTMENT_OTHER)
Payer: No Typology Code available for payment source | Admitting: Student in an Organized Health Care Education/Training Program

## 2022-08-17 NOTE — Progress Notes (Deleted)
[facility]  Return Visit Note    Chief Complaint:  No chief complaint on file.      HPI:   Ashlee Long is a 27 year old patient with a history of *** here today for return visit.    #      Physical Examination:   There were no vitals taken for this visit.    ***  Gen - well appearing, NAD   HENT -  neck is supple, no thyromegaly, no cervical LAD   RESP- Clear to auscultation bilaterally, no crackles or wheeze  CV- RRR, no m/g/r, no LE edema  ABD- soft, ND, NT, BS+ and normoactive, no organomegaly, no masses  MSK - no joint swelling or effusions, normal bulk and tone   SKIN - warm and dry  Neuro - no gross motor deficits appreciated  Psych - bright affect    Office Visit on 07/29/22   1. Trichomonas Nucleic Acid Detection   Result Value Ref Range    Trichomonas Nucleic Acid Spec. Vaginal     Trichomonas Nucleic Acid Res. Negative NRN   2. GC&CHLAM NUCLEIC ACID DETECTN   Result Value Ref Range    GC&Chlam NA Spec Desc Vaginal     Chlam Trachomatis Nucleic Acid Negative NRN    N.Gonorrhoeae(GC) Nucleic Acid Negative NRN       Assessment/Plan:        RTC in ***    I saw and discussed the patient with attending physician, Dr. Wyvonna Plum, MD  Internal Medicine, R3

## 2022-09-05 ENCOUNTER — Ambulatory Visit: Payer: No Typology Code available for payment source | Admitting: Clinical Social Worker

## 2022-09-05 DIAGNOSIS — F4310 Post-traumatic stress disorder, unspecified: Secondary | ICD-10-CM
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
## 2022-09-07 ENCOUNTER — Ambulatory Visit: Payer: No Typology Code available for payment source

## 2022-09-07 DIAGNOSIS — F411 Generalized anxiety disorder: Secondary | ICD-10-CM

## 2022-09-07 NOTE — Progress Notes (Signed)
[facility] Housing Note         Personal assistant    [_] client stopped in to check on waitlist status    [_] community housing/housing assistance referral    [x]  general housing consultation    [_] housing application assistance    [_] housing subsidy paperwork assistance    [_] home visit    [_] attempted outreach    [_] skills coaching    [_] education    [_] apartment assessment         Indirect Services    [_] telephone contact with client    [_] consult with apartment manager    [_] consult with apartment social services    [_] consult with housing subsidy staff    [_] consult with case manager/treatment team    [_] consult with housing staff/team    [_] collected donated housing supplies for client    [_] other: _       Intervention: TW met with client via telemed Zoom to discuss her current eviction notice. She owes 2621.00 in back rent and monthly rent is 1299/mo. She is currently without employment but is expecting to start a new job at the end of this month. Client says she has called 211 almost daily to no avail in resources. TW sent her links for [facility] (she will need to call and reset sername and password to reestablish account), [employer] assistance application, [employer] (instructed to call first), and [employer] representative. TW said there should be a payment plan option attached to the notice and that she should check that she wants that option and to discuss with landlord her situation.      Outcome/Plan: Housing staff will collaborate with clt. and clt's case manager around housing issues as needed.    Kara Pacer, MSW
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
## 2022-09-12 ENCOUNTER — Ambulatory Visit (HOSPITAL_BASED_OUTPATIENT_CLINIC_OR_DEPARTMENT_OTHER): Payer: No Typology Code available for payment source | Admitting: Psychiatry

## 2022-09-30 ENCOUNTER — Encounter (HOSPITAL_BASED_OUTPATIENT_CLINIC_OR_DEPARTMENT_OTHER): Payer: Self-pay | Admitting: Student in an Organized Health Care Education/Training Program

## 2022-09-30 ENCOUNTER — Telehealth (HOSPITAL_BASED_OUTPATIENT_CLINIC_OR_DEPARTMENT_OTHER): Payer: Self-pay | Admitting: Unknown Physician Specialty

## 2022-09-30 NOTE — Telephone Encounter (Signed)
Ashlee Long 09/30/22

## 2022-09-30 NOTE — Telephone Encounter (Signed)
Writer called client to follow up on referral made.  Writer briefly explained the SE program, that it is not like a staffing agency, we don't have jobs set aside for people but help with resumes, cover letters, interviews, applying for jobs, post employment support.  Client reports that she knows how to get a job and is not interested in enrolling in the SE program at this time, was hoping that there were jobs set aside she could get into quickly.  Writer encouraged client to reach out or speak with her case manager should she change her mind.    Signature  Roanna Raider, [place], [facility]  [employer]  [id number]  [place], [place]

## 2022-10-04 ENCOUNTER — Ambulatory Visit: Payer: No Typology Code available for payment source | Admitting: Clinical Social Worker

## 2022-10-04 DIAGNOSIS — F410 Panic disorder [episodic paroxysmal anxiety] without agoraphobia: Secondary | ICD-10-CM

## 2022-10-04 DIAGNOSIS — F4310 Post-traumatic stress disorder, unspecified: Secondary | ICD-10-CM

## 2022-10-04 NOTE — Progress Notes (Signed)
Ashlee Long did not cancel and was not present for a scheduled appointment today.  Disposition: Chart reviewed, patient contacted by phone regarding follow-up    Tw called [name] at 11:02am and LVM.    Germaine Pomfret, [facility]  Mental Health Practitioner  [facility]   [phone]

## 2022-10-07 ENCOUNTER — Encounter (HOSPITAL_BASED_OUTPATIENT_CLINIC_OR_DEPARTMENT_OTHER): Payer: Self-pay

## 2022-10-15 ENCOUNTER — Encounter (HOSPITAL_BASED_OUTPATIENT_CLINIC_OR_DEPARTMENT_OTHER): Payer: Self-pay | Admitting: Student in an Organized Health Care Education/Training Program

## 2022-10-18 NOTE — Telephone Encounter (Signed)
Called and scheduled pt for pap (ok'd by Dr. Charna Busman).

## 2022-10-25 ENCOUNTER — Ambulatory Visit
Payer: No Typology Code available for payment source | Attending: Unknown Physician Specialty | Admitting: Student in an Organized Health Care Education/Training Program

## 2022-10-25 VITALS — BP 108/76 | HR 93 | Temp 98.3°F | Wt 298.8 lb

## 2022-10-25 DIAGNOSIS — R87610 Atypical squamous cells of undetermined significance on cytologic smear of cervix (ASC-US): Secondary | ICD-10-CM

## 2022-10-25 DIAGNOSIS — R87810 Cervical high risk human papillomavirus (HPV) DNA test positive: Secondary | ICD-10-CM | POA: Insufficient documentation

## 2022-10-26 NOTE — Progress Notes (Signed)
[facility]  Return Visit Note    Chief Complaint:  Chief Complaint   Patient presents with    Health Maintenance Visit     Pap, wants ref to derm & wt loss, random radiating pain from shoulder blade to her arm       HPI:   Ashlee Long is a 27 year old patient with a history of abnormal pap smear here today for return visit for repeat pap.     Due to time constraints will return to discuss additional concerns.     Unfortunately lost job recently due to issues with documents (that she already completed) but knows what she needs to do   Attending a graduation today in [place]    #ASCUS/hrHPV pap  Due for repeat pap  ASCUS and HPV+ in 2023, colposcopy negative    Reports no new partners since last test and declines STI testing with exam        Physical Examination:   BP 108/76   Pulse 93   Temp 36.8 C (Temporal)   Wt (!) 135.5 kg (298 lb 12.8 oz)   LMP 10/08/2022   SpO2 96%   BMI 41.67 kg/m     Gen: well appearing, NAD  Heent: anicteric, EOMs grossly intact  Cardiovascular: No cyanosis, clubbing or edema.  Respiratory: good respiratory effort, no use of accessory muscles  Neurological: alert and oriented x 3  Psychiatric: normal affect  Skin: no rashes on visible skin  Ext: warm, well perfused  PELVIC:  Dr. Desiree Hane present. Normal appearing external female genitalia. No lesions, whitened epithelium, or erythema.  Vagina is pink, moist, and rugous without lesions.  No abnormal discharge.   Normal appearing cervix.  A pap smear was obtained.        Office Visit on 10/25/22   1. Cervical Cancer Screening    Narrative    The following orders were created for panel order Cervical Cancer Screening.  Procedure                               Abnormality         Status                     ---------                               -----------         ------                     PAP HPV Co-test[[id number]]                                                               Please view results for these tests on the  individual orders.       Assessment/Plan:    1. ASCUS/hrHPV+ (16/18 negative) Pap  Repeat cotest. Pending results will either be due for follow up in 1 year vs refer back to colpo clinic.   - Cervical Cancer Screening      RTC in 1 month to discuss additional concerns    I saw and discussed the patient with  attending physician, Dr. Altamese Cabal, MD  Internal Medicine, R3

## 2022-10-27 ENCOUNTER — Telehealth (HOSPITAL_BASED_OUTPATIENT_CLINIC_OR_DEPARTMENT_OTHER): Payer: Self-pay | Admitting: Student in an Organized Health Care Education/Training Program

## 2022-10-27 NOTE — Telephone Encounter (Signed)
Pss left tvm message/sent scheduling ticket to patient (July scheduling transfer of care, return long 60 minutes Ashlee Long)

## 2022-10-27 NOTE — Progress Notes (Signed)
I saw and evaluated the patient. I have reviewed the resident's documentation and agree with it.    Serafina Royals, M.D.  Internal Medicine  [facility]   [phone]

## 2022-10-31 NOTE — Progress Notes (Signed)
Vitals:    10/31/22 1406   BP: 110/68   Pulse: 84   Temp: 37 C (98.6 F)   Weight: 132.9 kg (293 lb)   Height: 1.803 m (5\' 11" )     Dental procedures in this visit    There are no dental procedures in this visit.         Description: 27 year old female present for restorative treatment  Chief complaint: denies   Reviewed medical health history     Adult Restorative Treatment        P: Treatment today: composite restorations    A: Health History form, Existing radiographs and Appointment Reason for today's visit     R: Nitrous: No N2O was used today   Topical Anesthetic: Yes (Specify): 20% benzocaine   Local Anesthetic: 1 Carpules of Lidocaine HCL 2%   with Epinephrine 1:100 via Nerve Block and Infiltration   Additional Anesthetic:    Medications Prescribed:  None    T: Composite restorations   First Tooth:  #5-D   Other teeth:    Removed caries, tooth preparation cleaned and dried, [place] Band placed, etched, prime and bond applied, Composite incrementally placed (Shade: A2b ), light cured, contacts checked, checked bite, adjusted bite, reviewed Oral Hygiene Instructions, patient/parent educated and post-op instructions given    ZO:XWRUE #[id number]     ............................................ CHINH D DO, DDS, 10/31/2022 2:52 PM  ....................................Marland Kitchen

## 2022-11-04 LAB — PAP ONLY: Cytologic Impression: UNDETERMINED

## 2022-11-04 LAB — PAP HPV CO-TEST
HPV 16 Genotype: NEGATIVE
HPV 18 Genotype: NEGATIVE
High Risk HPV Screening: POSITIVE — AB
Other High Risk HPV Genotype: POSITIVE

## 2022-11-04 NOTE — Result Encounter Note (Signed)
ASCUS with HR HPV, first pap since negative colpo in 2023. ASCUS with HR HPV prior to colpo. Per [facility]CCP due for 1 year follow up.

## 2022-11-11 ENCOUNTER — Telehealth (HOSPITAL_BASED_OUTPATIENT_CLINIC_OR_DEPARTMENT_OTHER): Payer: Self-pay | Admitting: Clinical Social Worker

## 2022-11-11 DIAGNOSIS — F4310 Post-traumatic stress disorder, unspecified: Secondary | ICD-10-CM

## 2022-11-11 NOTE — Telephone Encounter (Signed)
Tw reached out to [place] as tw had not been in contact with her in a while. Ashlee Long said she had been meaning to call and make an appointment and thanked tw for reaching out. Tw scheduled Ashlee Long to meet with tw and Dr. Jettie Pagan.    Germaine Pomfret, [facility]  Mental Health Practitioner  [facility] and Addiction Services   [phone]

## 2022-11-16 ENCOUNTER — Telehealth (HOSPITAL_BASED_OUTPATIENT_CLINIC_OR_DEPARTMENT_OTHER): Payer: Self-pay | Admitting: Clinical Social Worker

## 2022-11-16 DIAGNOSIS — F4310 Post-traumatic stress disorder, unspecified: Secondary | ICD-10-CM

## 2022-11-16 NOTE — Telephone Encounter (Signed)
Tw called Bernetta to let her know that a scheduling error occurred and she is now scheduled to meet with Dr. Jettie Pagan on 8/12. Asna thanked tw for letting her know.    Germaine Pomfret, [facility]  Mental Health Practitioner  [facility] and Addiction Services   [phone]

## 2022-11-17 NOTE — Progress Notes (Deleted)
No show

## 2022-11-18 ENCOUNTER — Ambulatory Visit: Payer: No Typology Code available for payment source | Admitting: Internal Medicine Res/Flw

## 2022-12-01 ENCOUNTER — Ambulatory Visit: Payer: No Typology Code available for payment source | Admitting: Clinical Social Worker

## 2022-12-01 NOTE — Progress Notes (Signed)
[place] did not cancel and was not present for a scheduled appointment today.  Disposition: Chart reviewed, patient contacted by phone regarding follow-up    Tw called and LVM regarding rescheduling. Acknowledged recent ED visit and wished them a quick recovery.    Germaine Pomfret, [facility]  Mental Health Practitioner  [facility] and Addiction Services   [phone]

## 2022-12-05 ENCOUNTER — Telehealth (HOSPITAL_BASED_OUTPATIENT_CLINIC_OR_DEPARTMENT_OTHER): Payer: Self-pay | Admitting: Internal Medicine Res/Flw

## 2022-12-05 NOTE — Telephone Encounter (Signed)
RETURN CALL: Voicemail - Detailed Message      SUBJECT:  Appointment Request     REASON FOR VISIT: ED follow up  PREFERRED DATE/TIME: asap  REASON UNABLE TO APPOINT: Patient needs to set up a ED follow up, first appt is 9/10

## 2022-12-12 ENCOUNTER — Ambulatory Visit
Payer: No Typology Code available for payment source | Attending: Student in an Organized Health Care Education/Training Program | Admitting: Student in an Organized Health Care Education/Training Program

## 2022-12-12 ENCOUNTER — Ambulatory Visit (HOSPITAL_BASED_OUTPATIENT_CLINIC_OR_DEPARTMENT_OTHER): Payer: No Typology Code available for payment source | Admitting: Clinical Social Worker

## 2022-12-12 VITALS — BP 123/83 | HR 81 | Temp 97.9°F | Wt 301.8 lb

## 2022-12-12 DIAGNOSIS — R21 Rash and other nonspecific skin eruption: Secondary | ICD-10-CM | POA: Insufficient documentation

## 2022-12-12 DIAGNOSIS — E282 Polycystic ovarian syndrome: Secondary | ICD-10-CM | POA: Insufficient documentation

## 2022-12-12 DIAGNOSIS — Z6841 Body Mass Index (BMI) 40.0 and over, adult: Secondary | ICD-10-CM

## 2022-12-12 DIAGNOSIS — N83209 Unspecified ovarian cyst, unspecified side: Secondary | ICD-10-CM

## 2022-12-12 DIAGNOSIS — Z309 Encounter for contraceptive management, unspecified: Secondary | ICD-10-CM | POA: Insufficient documentation

## 2022-12-12 MED ORDER — ETONOGESTREL-ETHINYL ESTRADIOL 0.12-0.015 MG/24HR VA RING
1.0000 | VAGINAL_RING | VAGINAL | 0 refills | Status: AC
Start: 2022-12-12 — End: ?

## 2022-12-12 MED ORDER — TRIAMCINOLONE ACETONIDE 0.1 % EX OINT
TOPICAL_OINTMENT | Freq: Two times a day (BID) | CUTANEOUS | 1 refills | Status: AC
Start: 2022-12-12 — End: ?

## 2022-12-12 NOTE — Progress Notes (Signed)
OB/GYN and Primary Care Clinic at [facility]  [address] [place], [place]  [address]  12/12/2022        Chief Complaint   Patient presents with    Follow-Up      ED f/u 11/28/2022  Ruptured ovarian cyst, discomfort, pain better  Breaking out on both arms       PCP: Kavin Leech, MD        HISTORY OF PRESENT ILLNESS     Ashlee Long is a 27 year old female who presents to acute care clinic for ER follow up    Seen in ER 11/28/22 for pelvic pain. Pelvic US consistent with ruptured cyst. Negative pregnancy test. Labs with WBC high and borderline low MCV    New rash since she left the hospital. History of eczema. Itchy on her arms bilaterally.     Has had ruptured ovarian cysts in the past, previousy needed cyst drained. Felt victoza helped with prior cysts., feels like weight loss helped.    Would like weight loss center to contact her to schedule again.     Previously tried slynd but broke out and had headaches  Tried nuvaring and got period regular and stopped because periods got normal. No hx of blood clots, HTN or aura with her migraines.    Previously saw naturopath and wondering about fertility work up. Has tried in the past to get pregnant over 10 years and has had an early pregnancy loss.        OBJECTIVE        Vitals :999}   BP 123/83   Pulse 81   Temp 36.6 C (Temporal)   Wt (!) 136.9 kg (301 lb 12.8 oz)   LMP 12/08/2022   SpO2 98%   BMI 42.09 kg/m      Physical Exam  Gen: well appearing, in no acute distress  Neuro: alert and oriented  Psych: euthymic, appropriate affect  Card: RRR  Ext: WWP, no edema  Pulm: no increased work of breathing  GI: soft, non tender, non distended abdomen  Skin: small flesh colored papules on bilateral forearms.         ASSESSMENT/PLAN      27 year old female presents for ER follow up of ruptured cyst    1. PCOS (polycystic ovarian syndrome)  2. Cyst of ovary, unspecified laterality  3. Encounter for contraceptive  management, unspecified type  Symptoms improving, just finished period. Open to restarting the ring for menstrual/ovulation regulation. No contraindications. Discussed that the ring does not impact fertility and she can reach out when she plans to get pregnant and we can refer for fertility work up then given her hx.   - etonogestrel-ethinyl estradiol (NuvaRing) 0.12-0.015 MG/24HR vaginal ring; Place 1 ring into the vagina every 28 days. Remove after 21 days. Insert new ring 7 days later.  Dispense: 13 ring; Refill: 0    4. BMI 40.0-44.9, adult (HCC)  Will route to weight loss clinic for visit scheduling as patient is already established there    5. Rash  Itchy. Possibly eczema. Avoid irritants and keep moisturized. Trial of steroid and return if ineffective.   - triamcinolone 0.1 % ointment; Apply topically 2 times a day. Apply to areas of rash for 2 weeks then as needed.  Dispense: 80 g; Refill: 1        RTC: PRN if symptoms don't improve    Maralyn Sago  Windle Guard, MD MPH  [facility] Internal Medicine

## 2022-12-13 ENCOUNTER — Encounter (INDEPENDENT_AMBULATORY_CARE_PROVIDER_SITE_OTHER): Payer: Self-pay

## 2022-12-13 NOTE — Telephone Encounter (Signed)
Pt returned clinic call and scheduled w/ Dr. Ninfa Meeker.
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
## 2022-12-19 ENCOUNTER — Ambulatory Visit: Payer: No Typology Code available for payment source | Admitting: Psychiatry

## 2022-12-19 DIAGNOSIS — R440 Auditory hallucinations: Secondary | ICD-10-CM

## 2022-12-19 DIAGNOSIS — F4310 Post-traumatic stress disorder, unspecified: Secondary | ICD-10-CM

## 2022-12-19 DIAGNOSIS — F411 Generalized anxiety disorder: Secondary | ICD-10-CM

## 2022-12-19 MED ORDER — BUSPIRONE HCL 5 MG OR TABS
5.0000 mg | ORAL_TABLET | Freq: Two times a day (BID) | ORAL | 1 refills | Status: AC
Start: 2022-12-19 — End: 2023-02-17

## 2022-12-19 MED ORDER — QUETIAPINE FUMARATE 50 MG OR TABS
50.0000 mg | ORAL_TABLET | Freq: Two times a day (BID) | ORAL | 2 refills | Status: AC | PRN
Start: 2022-12-19 — End: 2023-03-19

## 2022-12-19 MED ORDER — QUETIAPINE FUMARATE 200 MG OR TABS
200.0000 mg | ORAL_TABLET | Freq: Every evening | ORAL | 2 refills | Status: AC
Start: 2022-12-19 — End: 2023-03-19

## 2022-12-19 MED ORDER — QUETIAPINE FUMARATE 50 MG OR TABS
25.0000 mg | ORAL_TABLET | Freq: Every day | ORAL | 2 refills | Status: DC | PRN
Start: 2022-12-19 — End: 2022-12-19

## 2022-12-19 NOTE — Patient Instructions (Addendum)
It was nice to see you today. Here's what we discussed:     1. Please keep taking the quetiapine 200mg  at nighttime.     2. Please increase the as needed quetiapine to 50mg  up to two times daily as needed for anxiety or hallucinations    3. Stop the fluoxetine    4. Please start buspirone 5mg  twice daily    5. Please fax our clinic your old records. Our fax number is [phone].    6. We talked about two medications that we could think about starting to help reduce risk of restarting alcohol use: naltrexone or accamprosate. We talked about lamotrigine which can be used as a mood stabilizer.     7. We'll meet next on 9/16 at 1PM on telehealth    Take care,   Jodi Mourning, MD    ---------------------------------------------------------------------------------------------------------------------------------------------------------------------------------------------------------  Thank you for coming to clinic today. If you have a question and can wait 24-48 hours for a response, you may message me via your MyChart account. I  generally respond to these messages within 1-2 business days.    Please note that during today's visit we reviewed only your psychiatric (behavioral health) medications. Please follow-up with your primary care provider with any questions or concerns regarding other (non-psychiatric) medications.    Self-harm Prevention Safety Plan:  If your psychiatric symptoms worsen to the degree that you feel like you need to speak with a provider more urgently, please call [phone] and ask to speak to the Triage nurse or case manager. They can also help relay messages to your psychiatrist or another psychiatry provider in the clinic.   If you would like to speak with a peer for extra support please call the [place] Warm line: 1-877-500-WARM  If your symptoms worsen so much that you're thinking about hurting yourself or have thoughts about suicide please do one of the following:  - Call Suicide Prevention  Lifeline [phone] [phone]  - Call the Crisis Connection 24-hour line: 912-625-2571 (1-866-4CRISIS)  - Call 911   - Go to the emergency room

## 2022-12-24 ENCOUNTER — Other Ambulatory Visit: Payer: Self-pay

## 2022-12-24 ENCOUNTER — Emergency Department
Admission: EM | Admit: 2022-12-24 | Discharge: 2022-12-24 | Discharge status: Against Medical Advice | Payer: No Typology Code available for payment source | Attending: Emergency Medicine | Admitting: Emergency Medicine

## 2022-12-24 DIAGNOSIS — K029 Dental caries, unspecified: Secondary | ICD-10-CM | POA: Insufficient documentation

## 2022-12-24 DIAGNOSIS — K0889 Other specified disorders of teeth and supporting structures: Secondary | ICD-10-CM

## 2022-12-24 DIAGNOSIS — K047 Periapical abscess without sinus: Secondary | ICD-10-CM | POA: Insufficient documentation

## 2022-12-24 DIAGNOSIS — F209 Schizophrenia, unspecified: Secondary | ICD-10-CM | POA: Insufficient documentation

## 2022-12-24 DIAGNOSIS — F32A Depression, unspecified: Secondary | ICD-10-CM | POA: Insufficient documentation

## 2022-12-24 MED ORDER — IBUPROFEN 600 MG OR TABS
600.0000 mg | ORAL_TABLET | Freq: Once | ORAL | Status: DC
Start: 2022-12-24 — End: 2022-12-24
  Filled 2022-12-24: qty 1

## 2022-12-24 MED ORDER — ACETAMINOPHEN 500 MG OR TABS
1000.0000 mg | ORAL_TABLET | Freq: Once | ORAL | Status: DC
Start: 2022-12-24 — End: 2022-12-24
  Filled 2022-12-24: qty 2

## 2022-12-24 NOTE — ED Triage Notes (Signed)
L jaw px x 2 days. Pt taking Naproxen with no px relief. Pt called her Dentist and unable to get appt till Nov. Decrease ROM to Jaw, unable to eat r/t jaw px. +Sensation of jaw being stuck, denies trauma. Pt states px started after sleeping on L face, denies thinking source of px is from her teeth. Denies other c/os or hx of same, no obvious facial swelling noted    PMH: Asthma

## 2022-12-24 NOTE — ED Notes (Signed)
Pt comes through front triage w/ complaints of L jaw pain X2 days.  Pt has been taking OTC pain meds at home w/ no relief.  Pt denies trauma to the face, but has had recent dental work (about 3 weeks ago), however states that it was in the front of her mouth.  Pt denies fever, chills, n/v/d, but endorses headache.  No other symptoms noted.  No swelling appreciated.     Thornton Dales, [place]  12/24/22 [phone]

## 2022-12-24 NOTE — ED Provider Notes (Signed)
CHIEF COMPLAINT   Chief Complaint   Patient presents with    Jaw Pain            HISTORY OF PRESENT ILLNESS AND REVIEW OF SYSTEMS      Ashlee Long is a 27 yo female with a history of depression and schizophrenia who presents with left sided jaw pain for the past 2 days. She states the pain started suddenly when she woke up after she had been sleeping on her left side. She denies any trauma or strike to her left face. Pain is worse with talking and chewing. She states she went to a dentist in mid July who told her she had cavities her her left molars, but they were waiting to fix them until her wisdom teeth came in. She states she called her dentist, but was unable to get an appointment with them until October. She is able to open her mouth, but is limited by pain. She denies any fever, chills, abdominal pain, nausea, or vomiting.          PAST MEDICAL AND SURGICAL HISTORY   Past Medical History:   Diagnosis Date    Allergic rhinitis due to allergen     Anxiety     Asthma     Depression     Disorder of menstrual bleeding     GERD (gastroesophageal reflux disease)     Headache     Heart burn     Irregular menses     Lipidemia     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HCC)     NEGATIVE PAST MEDICAL HISTORY OF     PCOS (polycystic ovarian syndrome)     Pre-diabetes     Schizophrenia (HCC)     Vitamin D deficiency        Past Surgical History:   Procedure Laterality Date    LAPAROSCOPY DIAGNOSTIC / BIOPSY / ASPIRATION / LYSIS  07/11/2021    drainage of R sided ovarian cyst    NO PRIOR SURGERIES            MEDICATIONS AND ALLERGIES     OUTPATIENT MEDICATIONS:   Current Outpatient Medications   Medication Instructions    busPIRone (BUSPAR) 5 mg, Oral, 2 times daily    etonogestrel-ethinyl estradiol (NuvaRing) 0.12-0.015 MG/24HR vaginal ring 1 ring, Vaginal, Every 28 days, Remove after 21 days. Insert new ring 7 days later.    ibuprofen (MOTRIN) 600 mg, Oral, Every 8 hours PRN    Other Meds (See Sig/Instructions) Medication  name: MIRENA    QUEtiapine (SEROQUEL) 200 mg, Oral, Nightly    QUEtiapine (SEROQUEL) 50 mg, Oral, 2 times daily PRN    triamcinolone 0.1 % ointment Topical, 2 times daily, Apply to areas of rash for 2 weeks then as needed.    valACYclovir (VALTREX) 500 mg, Oral, 2 times daily, Take for 3 days for recurrent infection       ALLERGIES:   Abilify [aripiprazole]              SOCIAL HISTORY AND FAMILY HISTORY   Social History     Tobacco Use    Smoking status: Never   Substance Use Topics    Alcohol use: Yes     Alcohol/week: 2.0 standard drinks of alcohol     Types: 2 Standard drinks or equivalent per week     Comment: Occasional Teqqu    Drug use: Yes     Types: Marijuana, Inhaled     Comment: current smoker 2x  a day       Family History       Problem (# of Occurrences) Relation (Name,Age of Onset)    Heart Disease (1) Maternal Grandmother (Debra p)    Diabetes (2) Maternal Grandmother (Debra p): mothers side, Maternal Aunt Carolina Sink)    Substance Abuse (2) Maternal Aunt (Nellie l), Maternal Uncle (William p)    Fibromyalgia (1) Mother    Bleeding Tendency (2) Mother, Other (mat GGM)                  PHYSICAL EXAM   ED VITALS:  Vitals (Arrival)      T: (!) 35.2 C (12/24/22 0327)  BP: 123/74 (12/24/22 0327)  HR: 91 (12/24/22 0327)  RR: 18 (12/24/22 0327)  SpO2: 100 % (12/24/22 0327)     Vitals (Most recent in last 24 hrs)   T: 36.5 C (12/24/22 0633)  BP: (!) 125/96 (12/24/22 0633)  HR: 88 (12/24/22 0633)  RR: 18 (12/24/22 0633)  SpO2: 100 % (12/24/22 0633) Room air  T range: Temp  Min: 35.2 C  Max: 36.5 C  (no weight taken for this visit)     (no height taken for this visit)     There is no height or weight on file to calculate BMI.       Physical Exam  Constitutional:       Appearance: Normal appearance.   HENT:      Head: Normocephalic and atraumatic.      Comments: Patient able to open her mouth for examination, somewhat limited by pain     Mouth/Throat:      Comments: Obvious decay of the 1st and 2nd molar, no  obvious periapical abscess  Cardiovascular:      Rate and Rhythm: Normal rate and regular rhythm.      Pulses: Normal pulses.   Pulmonary:      Effort: Pulmonary effort is normal.      Breath sounds: Normal breath sounds.   Abdominal:      General: Abdomen is flat.   Lymphadenopathy:      Cervical: No cervical adenopathy.   Neurological:      Mental Status: She is alert.                ED COURSE/MEDICAL DECISION MAKING      Ashlee Long is a 27 yo female who presented with left sided left jaw pain, with signs of tooth decay over her left lower molars, without obvious periapical abscess. Offered tylenol and ibuprofen, she declined. Discussed the option of treatment with antibiotics, however she left the emergency department prior to treatment.   -If she returns could consider antibiotic treatment with Moxifloxacin and paradex mouthwash                   Medications Given in the ED:   Medications   acetaminophen (Tylenol) tablet 1,000 mg (1,000 mg Oral Refused 12/24/22 0654)   ibuprofen (Motrin) tablet 600 mg (600 mg Oral Refused 12/24/22 0654)              CLINICAL IMPRESSION AND DISPOSITION (Link)     Clinical Impressions:   [K08.89] Pain, dental   [K04.7] Dental infection        No follow-up provider specified.    Patient was given scripts for the following medications.  Discharge Medication List as of 12/24/2022  7:07 AM             Disposition: [facility]  CRITICAL CARE/ADDITIONAL INFORMATION REVIEWED ATTENDING ONLY                 Webb Laws, MD  Resident  12/24/22 [phone]

## 2022-12-26 NOTE — Progress Notes (Signed)
Encounter opened in error

## 2023-01-03 ENCOUNTER — Telehealth (INDEPENDENT_AMBULATORY_CARE_PROVIDER_SITE_OTHER): Payer: No Typology Code available for payment source | Admitting: Family Medicine

## 2023-01-03 VITALS — Ht 71.0 in | Wt 298.0 lb

## 2023-01-03 DIAGNOSIS — F411 Generalized anxiety disorder: Secondary | ICD-10-CM

## 2023-01-03 DIAGNOSIS — E669 Obesity, unspecified: Secondary | ICD-10-CM

## 2023-01-03 DIAGNOSIS — R7303 Prediabetes: Secondary | ICD-10-CM

## 2023-01-03 DIAGNOSIS — K219 Gastro-esophageal reflux disease without esophagitis: Secondary | ICD-10-CM

## 2023-01-03 DIAGNOSIS — J45909 Unspecified asthma, uncomplicated: Secondary | ICD-10-CM

## 2023-01-03 DIAGNOSIS — F209 Schizophrenia, unspecified: Secondary | ICD-10-CM

## 2023-01-03 DIAGNOSIS — F3181 Bipolar II disorder: Secondary | ICD-10-CM

## 2023-01-03 DIAGNOSIS — Z87898 Personal history of other specified conditions: Secondary | ICD-10-CM

## 2023-01-03 DIAGNOSIS — K909 Intestinal malabsorption, unspecified: Secondary | ICD-10-CM

## 2023-01-03 DIAGNOSIS — Z6841 Body Mass Index (BMI) 40.0 and over, adult: Secondary | ICD-10-CM

## 2023-01-03 MED ORDER — INSULIN PEN NEEDLE 32G X 4 MM MISC
1.0000 | Freq: Every day | 2 refills | Status: AC
Start: 2023-01-03 — End: 2023-09-30

## 2023-01-03 MED ORDER — LIRAGLUTIDE 18 MG/3ML SC SOPN
1.8000 mg | PEN_INJECTOR | Freq: Every day | SUBCUTANEOUS | 3 refills | Status: AC
Start: 2023-01-03 — End: 2023-12-29

## 2023-01-03 NOTE — Progress Notes (Signed)
[facility] MEDICINE CENTER FOR WEIGHT LOSS AND METABOLIC SURGERY PHYSICIAN FOLLOW UP NOTE  01/03/2023       ASSESSMENT/PLAN:  Ashlee Long has class 3 Obesity (Body mass index is 41.56 kg/m. )    Co-Morbid Conditions  OSA: never diagnosed  Pre-Diabetes, previously, most recent A1c 5.6% in June 2023  HTN: never diagnosed  Degenerative Arthritis: never diagnosed  GERD: compliant with treatment, started PPI 02/2022  Hyperlipidemia: monitoring, last checked June 2023  Other: PCOS, Asthma       Percent of body weight loss required for therapeutic benefit:  Diabetes prevention: 10% (80% reduction in risk)  Dyslipidemia: 5-15% or more (lower TG, higher HDL-c, lower non-HDL-c)  Asthma/reactive airway disease 7-8% or more (improvement in forced expiratory volume at 1 second, improved symptoms)  Gastroesophageal reflux disease (GERD): 10% or more (reduced symptom frequency and severity)  Polycystic ovarian syndrome (PCOS): 5-15% or more (ovulation, regulate menses, reduce hirsutism, enhance insulin sensitivity, reduce serum androgen levels)  Depression: Uncertain % (reduce depression symptomatology, improvement in depression scores)     Ashlee Long, et al, 2016 AACE/ACE Comprehensive Clinical Practice Guidelines for Medical Care of Patient's with Obesity)           Assessment/Progress toward goals:    Visit Date Weight (lb) Waist (in)  Hip (in)   Initial  304 lbs         Wt 298 lb (135.172 kg)                                                                       Since our last visit, Ashlee Long has lost 6 pounds.     WE AGREED TO THE FOLLOWING PLAN:    Nutrition:  [facility]    Exercise goal-3-5 hours per week of moderate to vigorous cardiovascular activity per week and resistance exercise 2 days per week    Behavior:   Motivational interviewing performed   Practice good sleep hygiene    Medications:    Restart liraglutide 1.8mg .  talk to pcp about taking over long-term refills    Surgery: not  recommended since this could destabilize mental health    Laboratory tests: see orders.     Other :  See pcp for wellness exam      Follow up: as needed     Please send ecare/Mychart message if any concerns regarding the above plan.    Please note, upon graduation from this 1-year medically supervised weight loss program, follow up visits will step down to 6-12 months and then yearly thereafter to provide for access for new patients to be enrolled.  We will be available if needed but we are a very limited service and are unable to have ongoing intensive follow up after a year.   Thank you for your understanding.       FURTHER RECOMMENDATIONS:   Obesity/overweight can increase risk of certain cancers and is recommended to keep up-to-date with cancer screening.      Thank you for referring your patient to the [facility] for Weight Loss and Metabolic Surgery. It was a pleasure to be involved in your patient's care.   Dr. Hiram Comber MD  Diplomate of the American Board of  Obesity Medicine   Co-Director for [facility] at [facility]  [address]   [place], [place]   [address]  [phone]      --------------------------------------------------------------------------------------    Distant Site Telemedicine Encounter  I conducted this encounter via secure, live, face-to-face video conference with the patient. I reviewed the risks and benefits of telemedicine as pertinent to this visit and the patient agreed to proceed.    Provider Location: Off-site location (home, [place] location)  Patient Location: At home    Present with patient: No one else present         Interpretor: n/a    CHIEF COMPLAINT:  Ashlee Long is a 27 year old female  who presents for follow-up regarding weight management for obesity and associated co-morbidities.     The following portions of the patient's history were reviewed with the patient and updated as appropriate: problem list,  current medications, allergies, past medical history, past surgical history, past social history and past family history.      HPI:  Ashlee Long is following up for medically supervised weight loss.        RECENT WEIGHTS:  Wt Readings from Last 5 Encounters:   01/03/23 (!) 135.2 kg (298 lb)   12/12/22 (!) 136.9 kg (301 lb 12.8 oz)   10/25/22 (!) 135.5 kg (298 lb 12.8 oz)   07/29/22 (!) 136.3 kg (300 lb 8 oz)   07/19/22 (!) 133.8 kg (295 lb)          INTERVAL HISTORY:  Since our last visit, Ashlee Long has been doing well  Patient wants to discuss refill of liraglutide    Current dietary habits:  Vegan diet.  Stopped eating sugars, processed foods.   No alcohol or marijuana      Physical activity:  -type: Her exercise regime consists of daily activities of daily living and includes active job.    30-60 min 8am workouts in the AM- treadmill.   - barriers/physical limitations: limited time given schedule working and taking classes  Steps per day: 5500     Sleep:  Better- on quetiapine.       CURRENT ANTI-OBESITY TREATMENT:   Liraglutide 1.8mg - last took Dec/23.  No ASE.     If applicable:   Birth Control: IUD       Mood: stable. Working with psychiatrist.         REVIEW OF SYSTEMS:  All other systems reviewed with the patient and otherwise negative except as outlined above and noted in the HPI    PHYSICAL EXAMINATION:   Ht 5\' 11"  (1.803 m)   Wt (!) 135.2 kg (298 lb)   LMP 12/08/2022   BMI 41.56 kg/m        Wt Readings from Last 5 Encounters:   01/03/23 (!) 135.2 kg (298 lb)   12/12/22 (!) 136.9 kg (301 lb 12.8 oz)   10/25/22 (!) 135.5 kg (298 lb 12.8 oz)   07/29/22 (!) 136.3 kg (300 lb 8 oz)   07/19/22 (!) 133.8 kg (295 lb)     BP Readings from Last 5 Encounters:   12/24/22 (!) 125/96   12/12/22 123/83   10/25/22 108/76   07/29/22 115/79   05/25/22 118/85       Physical Exam  Constitutional:       General: She is not in acute distress.  Eyes:      General: No scleral icterus.     Conjunctiva/sclera: Conjunctivae  normal.   Skin:     Coloration: Skin is not pale.   Neurological:      Mental Status: She is alert.   Psychiatric:         Mood and Affect: Mood normal.         Behavior: Behavior normal.         Thought Content: Thought content normal.         Judgment: Judgment normal.

## 2023-01-09 NOTE — Progress Notes (Addendum)
OB/GYN & PRIMARY CARE CLINICS AT [facility] [place] - RETURN VISIT    Dear patient, the purpose of this note is to convey information about your medical care to other health care providers.  Though you may learn about your own health by reading this note, the purpose of this note is not to provide patient education. This note may contain medical terms, abbreviations and codes that you do not understand.  Please discuss any question or concerns you have with your health care provider.    DATE:  01/10/23     ID/CC: 27 year old female with PMH significant for PCOS, asthma, GERD, PTSD, bipolar disorder, schizophrenia, GAD, borderline personality disorder, prior AUD (quit 08/2022),*** who presents to discuss ***    HPI:  ***  Interval hx  - 7/22: ED visit for ruptured ovarian cyst --> 8/5: f/u at [facility], restarted on NuvaRing  - 8/12: initial appt with [facility]; fluoxetine d/c'd and switched to buspirone for anxiety, Seroquel PRN increased  - 8/17: ED visit for L-sided dental/jaw pain, however, she self-discharged before evaluation/treatment was completed  - 8/27: seen by [facility]; restarted on liraglutide 1.8mg  with instructions to have PCP take over long-term refills    #Mood  - buspirone AE: (***dizziness, N/V, HA, drowsiness)    #BMI >40  - liraglutide AE:    #Jaw/dental pain  -     MEDICATIONS  Current Outpatient Medications   Medication Sig Dispense Refill    busPIRone 5 MG tablet Take 1 tablet (5 mg) by mouth 2 times a day. 60 tablet 1    etonogestrel-ethinyl estradiol (NuvaRing) 0.12-0.015 MG/24HR vaginal ring Place 1 ring into the vagina every 28 days. Remove after 21 days. Insert new ring 7 days later. 13 ring 0    ibuprofen 600 MG tablet Take 1 tablet (600 mg) by mouth every 8 hours as needed for pain, mild pain or moderate pain. (Patient not taking: Reported on 07/19/2022) 15 tablet 0    Insulin Pen Needle 32G X 4 MM miscellaneous Use 1 each daily. 90 each 2     liraglutide (Victoza) 18 MG/3ML pen-injector Inject 1.8 mg under the skin daily. GENERIC OK 27 mL 3    Other Meds (See Sig/Instructions) Medication name: MIRENA (Patient not taking: Reported on 07/19/2022)      QUEtiapine 200 MG tablet Take 1 tablet (200 mg) by mouth at bedtime. 30 tablet 2    QUEtiapine 50 MG tablet Take 1 tablet (50 mg) by mouth 2 times a day as needed (anxiety or hallucinations). 60 tablet 2    triamcinolone 0.1 % ointment Apply topically 2 times a day. Apply to areas of rash for 2 weeks then as needed. 80 g 1    valACYclovir 500 MG tablet Take 1 tablet (500 mg) by mouth 2 times a day. Take for 3 days for recurrent infection (Patient not taking: Reported on 07/19/2022) 6 tablet 3     No current facility-administered medications for this visit.       PHYSICAL EXAM:  Vitals: Last menstrual period 12/08/2022.   General: no acute distress  Skin: no rashes or lesions appreciated on exposed skin  Lungs: normal work of breathing  Neuro: alert & oriented    ASSESSMENT/PLAN:  There are no diagnoses linked to this encounter.     ***  Followed by [facility], next appt 9/16.     ***#HCM  Has active lab orders (HbA1c, CMP, lipid  panel, CBC, folate, B12, vit D, thiamine, TSH). Previously prediabetic.   - Encouraged to complete labs  - Will need 10/2023 pap smear (2024: ASCUS w/ high risk HPV)    Return to clinic: {F/u RUEA:540981} or sooner if new issues arise.     I {saw/discussed:107782} the patient with {WHCCIMPROVIDERS:120640}, attending physician, for {visit XBJY:782956}.    Alicia Amel, MD  Internal Medicine, PGY-1  ***refresh SmartText before signing***

## 2023-01-10 ENCOUNTER — Ambulatory Visit (HOSPITAL_BASED_OUTPATIENT_CLINIC_OR_DEPARTMENT_OTHER): Payer: No Typology Code available for payment source | Admitting: Internal Medicine Res/Flw

## 2023-01-10 ENCOUNTER — Ambulatory Visit: Payer: No Typology Code available for payment source | Admitting: Internal Medicine Res/Flw

## 2023-01-10 ENCOUNTER — Encounter (HOSPITAL_BASED_OUTPATIENT_CLINIC_OR_DEPARTMENT_OTHER): Payer: Self-pay

## 2023-01-10 ENCOUNTER — Telehealth (HOSPITAL_BASED_OUTPATIENT_CLINIC_OR_DEPARTMENT_OTHER): Payer: Self-pay | Admitting: Clinical

## 2023-01-10 DIAGNOSIS — Z6841 Body Mass Index (BMI) 40.0 and over, adult: Secondary | ICD-10-CM

## 2023-01-10 DIAGNOSIS — Z9189 Other specified personal risk factors, not elsewhere classified: Secondary | ICD-10-CM

## 2023-01-10 DIAGNOSIS — F411 Generalized anxiety disorder: Secondary | ICD-10-CM
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
## 2023-01-10 NOTE — Addendum Note (Signed)
Addended byCollie Siad on: 01/10/2023 04:53 PM     Modules accepted: Level of Service
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
## 2023-01-23 ENCOUNTER — Ambulatory Visit (HOSPITAL_BASED_OUTPATIENT_CLINIC_OR_DEPARTMENT_OTHER): Payer: No Typology Code available for payment source | Admitting: Psychiatry

## 2023-01-24 ENCOUNTER — Telehealth (HOSPITAL_BASED_OUTPATIENT_CLINIC_OR_DEPARTMENT_OTHER): Payer: Self-pay | Admitting: Clinical
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
## 2023-02-01 ENCOUNTER — Telehealth (HOSPITAL_BASED_OUTPATIENT_CLINIC_OR_DEPARTMENT_OTHER): Payer: Self-pay | Admitting: Clinical Social Worker

## 2023-02-01 ENCOUNTER — Encounter (HOSPITAL_BASED_OUTPATIENT_CLINIC_OR_DEPARTMENT_OTHER): Payer: Self-pay | Admitting: Clinical Social Worker

## 2023-02-01 DIAGNOSIS — F4310 Post-traumatic stress disorder, unspecified: Secondary | ICD-10-CM
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
## 2023-02-21 ENCOUNTER — Other Ambulatory Visit: Payer: Self-pay

## 2024-01-01 ENCOUNTER — Encounter (HOSPITAL_BASED_OUTPATIENT_CLINIC_OR_DEPARTMENT_OTHER): Payer: Self-pay | Admitting: Internal Medicine Res/Flw

## 2024-01-01 NOTE — Telephone Encounter (Signed)
 Duey 01/01/24

## 2024-02-06 ENCOUNTER — Encounter (HOSPITAL_BASED_OUTPATIENT_CLINIC_OR_DEPARTMENT_OTHER): Payer: Self-pay | Admitting: Internal Medicine Res/Flw

## 2024-05-01 NOTE — Telephone Encounter (Signed)
 Tried calling Ashlee Long but no answer and voicemail is full. Sending a letter instead.

## 9592-07-07 DEATH — deceased
